# Patient Record
Sex: Male | Born: 1955
Health system: Southern US, Community
[De-identification: ages and names within clinical notes are randomized; demographics above are authoritative.]

## PROBLEM LIST (undated history)

## (undated) HISTORY — PX: COLONOSCOPY: SHX174

---

## 1998-05-09 ENCOUNTER — Encounter: Admission: RE | Admit: 1998-05-09 | Discharge: 1998-05-09 | Payer: Self-pay | Admitting: Sports Medicine

## 1998-08-30 ENCOUNTER — Encounter: Admission: RE | Admit: 1998-08-30 | Discharge: 1998-08-30 | Payer: Self-pay | Admitting: Family Medicine

## 1998-09-04 ENCOUNTER — Encounter: Admission: RE | Admit: 1998-09-04 | Discharge: 1998-09-04 | Payer: Self-pay | Admitting: Sports Medicine

## 1998-10-10 ENCOUNTER — Encounter: Admission: RE | Admit: 1998-10-10 | Discharge: 1998-10-10 | Payer: Self-pay | Admitting: Family Medicine

## 1998-11-06 ENCOUNTER — Encounter: Admission: RE | Admit: 1998-11-06 | Discharge: 1998-11-06 | Payer: Self-pay | Admitting: Sports Medicine

## 2000-03-26 ENCOUNTER — Encounter: Admission: RE | Admit: 2000-03-26 | Discharge: 2000-03-26 | Payer: Self-pay | Admitting: Family Medicine

## 2001-04-13 ENCOUNTER — Encounter: Admission: RE | Admit: 2001-04-13 | Discharge: 2001-04-13 | Payer: Self-pay | Admitting: Family Medicine

## 2002-04-11 ENCOUNTER — Encounter: Admission: RE | Admit: 2002-04-11 | Discharge: 2002-04-11 | Payer: Self-pay | Admitting: Family Medicine

## 2003-09-07 ENCOUNTER — Encounter: Admission: RE | Admit: 2003-09-07 | Discharge: 2003-09-07 | Payer: Self-pay | Admitting: Sports Medicine

## 2006-01-30 ENCOUNTER — Ambulatory Visit: Payer: Self-pay | Admitting: Family Medicine

## 2006-01-30 LAB — CONVERTED CEMR LAB: Glucose, Urine, Semiquant: 93

## 2006-07-09 DIAGNOSIS — M545 Low back pain, unspecified: Secondary | ICD-10-CM | POA: Insufficient documentation

## 2006-07-09 DIAGNOSIS — G43909 Migraine, unspecified, not intractable, without status migrainosus: Secondary | ICD-10-CM | POA: Insufficient documentation

## 2006-07-09 DIAGNOSIS — M7710 Lateral epicondylitis, unspecified elbow: Secondary | ICD-10-CM | POA: Insufficient documentation

## 2006-07-09 DIAGNOSIS — E7800 Pure hypercholesterolemia, unspecified: Secondary | ICD-10-CM | POA: Insufficient documentation

## 2006-07-09 DIAGNOSIS — Z87448 Personal history of other diseases of urinary system: Secondary | ICD-10-CM | POA: Insufficient documentation

## 2007-04-25 ENCOUNTER — Emergency Department (HOSPITAL_COMMUNITY): Admission: EM | Admit: 2007-04-25 | Discharge: 2007-04-25 | Payer: Self-pay | Admitting: Emergency Medicine

## 2009-02-08 ENCOUNTER — Ambulatory Visit: Payer: Self-pay | Admitting: Family Medicine

## 2009-02-13 ENCOUNTER — Encounter: Payer: Self-pay | Admitting: Family Medicine

## 2009-02-13 ENCOUNTER — Ambulatory Visit: Payer: Self-pay | Admitting: Family Medicine

## 2009-02-13 LAB — CONVERTED CEMR LAB
ALT: 31 units/L (ref 0–53)
AST: 27 units/L (ref 0–37)
Alkaline Phosphatase: 33 units/L — ABNORMAL LOW (ref 39–117)
Calcium: 9.3 mg/dL (ref 8.4–10.5)
Cholesterol: 259 mg/dL — ABNORMAL HIGH (ref 0–200)
Creatinine, Ser: 1.09 mg/dL (ref 0.40–1.50)
HDL: 50 mg/dL (ref >39)
Total CHOL/HDL Ratio: 5.2
Triglycerides: 96 mg/dL (ref <150)

## 2009-02-14 ENCOUNTER — Encounter: Payer: Self-pay | Admitting: Family Medicine

## 2009-02-20 ENCOUNTER — Encounter: Payer: Self-pay | Admitting: Family Medicine

## 2010-04-10 ENCOUNTER — Ambulatory Visit: Payer: Self-pay | Admitting: Family Medicine

## 2010-04-10 ENCOUNTER — Encounter: Payer: Self-pay | Admitting: Family Medicine

## 2010-04-11 LAB — CONVERTED CEMR LAB
ALT: 18 units/L (ref 0–53)
Albumin: 4.5 g/dL (ref 3.5–5.2)
BUN: 11 mg/dL (ref 6–23)
Cholesterol: 265 mg/dL — ABNORMAL HIGH (ref 0–200)
Creatinine, Ser: 1.02 mg/dL (ref 0.40–1.50)
Glucose, Bld: 107 mg/dL — ABNORMAL HIGH (ref 70–99)
Potassium: 4.2 meq/L (ref 3.5–5.3)
Total Bilirubin: 1 mg/dL (ref 0.3–1.2)
Total Protein: 7.4 g/dL (ref 6.0–8.3)
VLDL: 18 mg/dL (ref 0–40)

## 2010-05-21 ENCOUNTER — Telehealth: Payer: Self-pay | Admitting: Family Medicine

## 2010-06-13 NOTE — Assessment & Plan Note (Signed)
Summary: cpe/tlb   Vital Signs:  Patient profile:   55 year old male Height:      67 inches Weight:      178 pounds BMI:     27.98 Pulse rate:   68 / minute BP sitting:   121 / 81  (right arm) Cuff size:   regular  Vitals Entered By: Tessie Fass CMA (April 10, 2010 9:01 AM) CC: CPE, Lipid Management Is Patient Diabetic? No Pain Assessment Patient in pain? no        Primary Care [facility]:  Doree Albee MD  CC:  CPE and Lipid Management.  History of Present Illness: No acute complaints per pt. Back function and ROM approaching baseline per pt. 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   Lipid Management History:      Positive NCEP/ATP III risk factors include male age 19 years old or older.  Negative NCEP/ATP III risk factors include non-tobacco-user status.        Adjunctive measures started by the patient include fiber and weight reduction.  Comments include: Eats overall good diet. eats primarily baked goods and vegetables. has begun retraining for cycling. .     Habits & Providers  Alcohol-Tobacco-Diet     Alcohol drinks/day: 0     Tobacco Status: never  Exercise-Depression-Behavior     Does Patient Exercise: yes     Type of exercise: cycling      Have you felt down or hopeless? no     Have you felt little pleasure in things? no     STD Risk: never     Drug Use: never     Seat Belt Use: always     Sun Exposure: frequent  Current Problems (verified): 1)  Special Screening Malignant Neoplasm of Prostate  (ICD-V76.44) 2)  Lumbago  (ICD-724.2) 3)  Routine General Medical Exam@[facility]  (ICD-V70.0) 4)  Tennis Elbow  (ICD-726.32) 5)  Migraine, Unspec., w/o Intractable Migraine  (ICD-346.90) 6)  Hypercholesterolemia  (ICD-272.0) 7)  Hematuria, Hx of  (ICD-V13.09) 8)  Back Pain, Low  (ICD-724.2)  Allergies: No Known Drug Allergies  Past History:  Past Medical History: Last updated: 02/08/2009 mild seasonal allergies  Past Surgical  History: Last updated: 02/08/2009 Audiology screen - R ear w/ deficit - 04/11/2002 Fasting glucose 93 - 01/10/2006, Fasting glucose 94 - 09/07/2003 IVP negative - 09/10/1998, UA negative - 09/07/2003 Vision screen WNL - 04/11/2002  Social History: STD Risk:  never Sun Exposure-Excessive:  frequent  Physical Exam  General:  alert and well-developed.   Head:  normocephalic and atraumatic.   Eyes:  vision grossly intact.   Ears:  R ear normal and L ear normal.   Nose:  no external deformity.   Mouth:  good dentition.   Neck:  supple and full ROM.   Lungs:  normal respiratory effort and no wheezes.   Heart:  normal rate, regular rhythm, and no murmur.   Abdomen:  soft, non-tender, and normal bowel sounds.   Msk:  normal ROM.   Neurologic:  alert & oriented X3, cranial nerves II-XII intact, and strength normal in all extremities.     Impression & Recommendations:  Problem # 1:  ROUTINE GENERAL MEDICAL EXAM@[facility] (ICD-V70.0) Overall normal physical exam. will send for colonoscopy. Also encouraged goals for cycling.   Problem # 2:  HYPERCHOLESTEROLEMIA (ICD-272.0) Will recheck lipid panel and also refer for nutritional counseling. Has history of pravachol use in the past. Will likley need in  future.  Orders: T-Lipid Profile ([id number]) Nutrition Referral (Nutrition)  Other Orders: Colonoscopy (Colon) T-Hepatic Function [phone]) T-Comprehensive Metabolic Panel [id number])  Lipid Assessment/Plan:      Based on NCEP/ATP III, the patient's risk factor category is "0-1 risk factors".  The patient's lipid goals are as follows: Total cholesterol goal is 200; LDL cholesterol goal is 160; HDL cholesterol goal is 40; Triglyceride goal is 150.  His LDL cholesterol goal has not been met.    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    Pneumococcal vaccine: Not documented  Colorectal Screening   Hemoccult: Not documented    Colonoscopy: Not documented   Colonoscopy action/deferral: GI Referral  (04/10/2010)  Other Screening   PSA: 0.45  (02/13/2009)   PSA action/deferral: Discussed-PSA requested  (02/08/2009)   Smoking status: never  (04/10/2010)  Lipids   Total Cholesterol: 259  (02/13/2009)   Lipid panel action/deferral: Lipid Panel ordered   LDL: 190  (02/13/2009)   LDL Direct: Not documented   HDL: 50  (02/13/2009)   Triglycerides: 96  (02/13/2009)    SGOT (AST): 27  (02/13/2009)   BMP action: Ordered   SGPT (ALT): 31  (02/13/2009) CMP ordered    Alkaline phosphatase: 33  (02/13/2009)   Total bilirubin: 0.9  (02/13/2009)    Lipid flowsheet reviewed?: Yes   Progress toward LDL goal: Unchanged  Self-Management Support :   Personal Goals (by the next clinic visit) :      Personal LDL goal: 160  (02/08/2009)    Patient will work on the following items until the next clinic visit to reach self-care goals:     Eating: eat baked foods instead of fried foods, eat fruit for snacks and desserts  (04/10/2010)   Referred.    Lipid self-management support: Education handout, Referred for medical nutrition therapy  (04/10/2010)     Lipid education handout printed    Lipid self-management support not done because: Not indicated  (02/08/2009)   Nursing  Instructions: Screening colonoscopy ordered Refer for medical nutrition therapy (see order)    Diabetes Self Management Training Referral Patient Name: George Aguilar [facility] Date Of Birth: [birth year] MRN: [id number] Current Diagnosis:  SPECIAL SCREENING MALIGNANT NEOPLASM OF PROSTATE (ICD-V76.44) LUMBAGO (ICD-724.2) ROUTINE GENERAL MEDICAL EXAM@[facility] F[facility] (ICD-V70.0) TENNIS ELBOW (ICD-726.32) MIGRAINE, UNSPEC., W/O INTRACTABLE MIGRAINE (ICD-346.90) HYPERCHOLESTEROLEMIA (ICD-272.0) HEMATURIA, HX OF (ICD-V13.09) BACK PAIN, LOW (ICD-724.2)   Complicating Conditions:  Dyslipidemia  Appended Document: cpe/tlb    Clinical Lists Changes  Orders: Added new Test order of [facility]- Est Level  3 ([id number]) - Signed      Appended Document: cpe/tlb     Allergies: No Known Drug Allergies   Other Orders: [facility] - Est  40-64 yrs ([id number])   Orders Added: 1)  [facility] -  Est  40-64 yrs [[id number]]

## 2010-06-13 NOTE — Progress Notes (Signed)
Summary: results  Phone Note Call from Patient Call back at [phone]   Caller: Patient Summary of Call: Mr. Niu has been waiting for lab results from his visit last year.  Never received a call or letter.  Please call him back with info. Initial call taken by: Abundio Miu,  May 21, 2010 10:31 AM  Follow-up for Phone Call        calling again to see what results are - spoke with them about nurtrion appt and they are wondering why Follow-up by: De Nurse,  May 23, 2010 10:45 AM  Additional Follow-up for Phone Call Additional follow up Details #1::        Called to folowup with pt about lab results. Will start on crestor 20. Will followup in 1-2 months. Gave red flags about muscle pain and weakness. Pt agreeable to plan.  Doree Albee MD May 24, 2010 5:32 PM     New/Updated Medications: CRESTOR 20 MG TABS (ROSUVASTATIN CALCIUM) 1 tablet daily Prescriptions: CRESTOR 20 MG TABS (ROSUVASTATIN CALCIUM) 1 tablet daily  #30 x 6   Entered and Authorized by:   Doree Albee MD   Signed by:   Doree Albee MD on 05/24/2010   Method used:   Electronically to        [facility] (retail)       [address]       [place], [place]  [address]       Ph: [phone]       Fax: [phone]   RxID:   [id number]

## 2010-07-17 ENCOUNTER — Other Ambulatory Visit: Payer: Self-pay | Admitting: Family Medicine

## 2010-07-17 ENCOUNTER — Ambulatory Visit (INDEPENDENT_AMBULATORY_CARE_PROVIDER_SITE_OTHER): Payer: BC Managed Care – PPO | Admitting: Family Medicine

## 2010-07-17 ENCOUNTER — Encounter: Payer: Self-pay | Admitting: Family Medicine

## 2010-07-17 VITALS — BP 149/84 | HR 88 | Temp 98.4°F | Ht 67.0 in | Wt 182.0 lb

## 2010-07-17 DIAGNOSIS — R300 Dysuria: Secondary | ICD-10-CM

## 2010-07-17 DIAGNOSIS — Z202 Contact with and (suspected) exposure to infections with a predominantly sexual mode of transmission: Secondary | ICD-10-CM

## 2010-07-17 DIAGNOSIS — E785 Hyperlipidemia, unspecified: Secondary | ICD-10-CM

## 2010-07-17 DIAGNOSIS — E7800 Pure hypercholesterolemia, unspecified: Secondary | ICD-10-CM

## 2010-07-17 LAB — POCT URINALYSIS DIPSTICK
Bilirubin, UA: NEGATIVE
Ketones, UA: NEGATIVE
Nitrite, UA: NEGATIVE

## 2010-07-17 LAB — HIV ANTIBODY (ROUTINE TESTING W REFLEX): HIV: NONREACTIVE

## 2010-07-17 LAB — CONVERTED CEMR LAB: Chlamydia, Swab/Urine, PCR: NEGATIVE

## 2010-07-17 MED ORDER — ASPIRIN EC 81 MG PO TBEC: 81.0000 mg | DELAYED_RELEASE_TABLET | Freq: Every day | ORAL | Status: AC

## 2010-07-17 MED ORDER — PRAVASTATIN SODIUM 40 MG PO TABS: 40.0000 mg | ORAL_TABLET | Freq: Every evening | ORAL | Status: DC

## 2010-07-17 MED ORDER — METRONIDAZOLE 500 MG PO TABS: ORAL_TABLET | ORAL | Status: AC

## 2010-07-17 NOTE — Assessment & Plan Note (Signed)
Will check urine GC/chl, RPR, HIV. Will treat for trich. Discussed safe sex practices.

## 2010-07-17 NOTE — Assessment & Plan Note (Addendum)
Will start pt on pravachol 40 given LDL level of 189 and intolerance of crestor. Will follow up in 2 months for likely recheck of LDL. Encouraged continued low fat diet and exercise.

## 2010-07-17 NOTE — Patient Instructions (Signed)
Hypertriglyceridemia    Diet for High blood levels of Triglycerides  Most fats in food are triglycerides. Triglycerides in your blood are stored as fat in your body. High levels of triglycerides in your blood may put you at a greater risk for heart disease and stroke.    Normal triglyceride levels are less than 150 mg/dL. Borderline high levels are 150-199 mg/dl. High levels are 200 - 499 mg/dL, and very high triglyceride levels are greater than 500 mg/dL. The decision to treat high triglycerides is generally based on the level. For people with borderline or high triglyceride levels, treatment includes weight loss and exercise. Drugs are recommended for people with very high triglyceride levels.  Many people who need treatment for high triglyceride levels have metabolic syndrome. This syndrome is a collection of disorders that often include: insulin resistance, high blood pressure, blood clotting problems, high cholesterol and triglycerides.  TESTING PROCEDURE FOR TRIGLYCERIDES   You should not eat 4 hours before getting your triglycerides measured. The normal range of triglycerides is between 10 and 250 milligrams per deciliter (mg/dl). Some people may have extreme levels (1000 or above), but your triglyceride level may be too high if it is above 150 mg/dl, depending on what other risk factors you have for heart disease.    People with high blood triglycerides may also have high blood cholesterol levels. If you have high blood cholesterol as well as high blood triglycerides, your risk for heart disease is probably greater than if you only had high triglycerides. High blood cholesterol is one of the main risk factors for heart disease.   CHANGING YOUR DIET     Your weight can affect your blood triglyceride level. If you are more than 20% above your ideal body weight, you may be able to lower your blood triglycerides by losing weight. Eating less and exercising regularly is the best way to combat this. Fat provides more calories than any other food. The best way to lose weight is to eat less fat. Only 30% of your total calories should come from fat. Less than 7% of your diet should come from saturated fat. A diet low in fat and saturated fat is the same as a diet to decrease blood cholesterol. By eating a diet lower in fat, you may lose weight, lower your blood cholesterol, and lower your blood triglyceride level.    Eating a diet low in fat, especially saturated fat, may also help you lower your blood triglyceride level. Ask your dietitian to help you figure how much fat you can eat based on the number of calories your caregiver has prescribed for you.    Exercise, in addition to helping with weight loss may also help lower triglyceride levels.    Alcohol can increase blood triglycerides. You may need to stop drinking alcoholic beverages.    Too much carbohydrate in your diet may also increase your blood triglycerides. Some complex carbohydrates are necessary in your diet. These may include bread, rice, potatoes, other starchy vegetables and cereals.    Reduce "simple" carbohydrates. These may include pure sugars, candy, honey, and jelly without losing other nutrients. If you have the kind of high blood triglycerides that is affected by the amount of carbohydrates in your diet, you will need to eat less sugar and less high-sugar foods. Your caregiver can help you with this.    Adding 2-4 grams of fish oil (EPA+ DHA) may also help lower triglycerides. Speak with your caregiver before adding any supplements to   your regimen.   Following the Diet    Maintain your ideal weight. Your caregivers can help you with a diet. Generally, eating less food and getting more exercise will help you lose weight. Joining a weight control group may also help. Ask your caregivers for a good weight control group in your area.    Eat low-fat foods instead of high-fat foods. This can help you lose weight too.    These foods are lower in fat. Eat MORE of these:    Dried beans, peas, and lentils.      Egg whites.      Low-fat cottage cheese.      Fish.     Lean cuts of meat, such as round, sirloin, rump, and flank (cut extra fat off meat you fix).     Whole grain breads, cereals and pasta.      Skim and nonfat dry milk.      Low-fat yogurt.      Poultry without the skin.      Cheese made with skim or part-skim milk, such as mozzarella, parmesan, farmers', ricotta, or pot cheese.      These are higher fat foods. Eat LESS of these:    Whole milk and foods made from whole milk, such as American, blue, cheddar, monterey jack, and swiss cheese    High-fat meats, such as luncheon meats, sausages, knockwurst, bratwurst, hot dogs, ribs, corned beef, ground pork, and regular ground beef.    Fried foods.   Limit saturated fats in your diet. Substituting unsaturated fat for saturated fat may decrease your blood triglyceride level. You will need to read package labels to know which products contain saturated fats.    These foods are high in saturated fat. Eat LESS of these:    Fried pork skins.    Whole milk.      Skin and fat from poultry.      Palm oil.      Butter.     Shortening.     Cream cheese.      Bacon.     Margarines and baked goods made from listed oils.      Vegetable shortenings.     Chitterlings.    Fat from meats.      Coconut oil.      Palm kernel oil.      Lard.     Cream.     Sour cream.      Fatback.     Coffee whiteners and non-dairy creamers made with these oils.      Cheese made from whole milk.       Use unsaturated fats (both polyunsaturated and monounsaturated) moderately. Remember, even though unsaturated fats are better than saturated fats; you still want a diet low in total fat.    These foods are high in unsaturated fat:    Canola oil.    Sunflower oil.      Mayonnaise.     Almonds.     Peanuts.     Pine nuts.      Margarines made with these oils.     Safflower oil.    Olive oil.      Avocados.     Cashews.     Peanut butter.      Sunflower seeds.     Soybean oil.    Peanut oil.      Olives.     Pecans.     Walnuts.     Pumpkin seeds.        Avoid sugar and other high-sugar foods. This will decrease carbohydrates without decreasing other nutrients. Sugar in your food goes rapidly to your blood. When there is excess sugar in your blood, your liver may use it to make more triglycerides. Sugar also contains calories without other important nutrients.    Eat LESS of these:    Sugar, brown sugar, powdered sugar, jam, jelly, preserves, honey, syrup, molasses, pies, candy, cakes, cookies, frosting, pastries, colas, soft drinks, punches, fruit drinks, and regular gelatin.    Avoid alcohol. Alcohol, even more than sugar, may increase blood triglycerides. In addition, alcohol is high in calories and low in nutrients. Ask for sparkling water, or a diet soft drink instead of an alcoholic beverage.   Suggestions for planning and preparing meals    Bake, broil, grill or roast meats instead of frying.    Remove fat from meats and skin from poultry before cooking.    Add spices, herbs, lemon juice or vinegar to vegetables instead of salt, rich sauces or gravies.    Use a non-stick skillet without fat or use no-stick sprays.    Cool and refrigerate stews and broth. Then remove the hardened fat floating on the surface before serving.    Refrigerate meat drippings and skim off fat to make low-fat gravies.    Serve more fish.     Use less butter, margarine and other high-fat spreads on bread or vegetables.    Use skim or reconstituted non-fat dry milk for cooking.    Cook with low-fat cheeses.    Substitute low-fat yogurt or cottage cheese for all or part of the sour cream in recipes for sauces, dips or congealed salads.    Use half yogurt/half mayonnaise in salad recipes.    Substitute evaporated skim milk for cream. Evaporated skim milk or reconstituted non-fat dry milk can be whipped and substituted for whipped cream in certain recipes.    Choose fresh fruits for dessert instead of high-fat foods such as pies or cakes. Fruits are naturally low in fat.   When Dining Out    Order low-fat appetizers such as fruit or vegetable juice, pasta with vegetables or tomato sauce.    Select clear, rather than cream soups.    Ask that dressings and gravies be served on the side. Then use less of them.    Order foods that are baked, broiled, poached, steamed, stir-fried, or roasted.    Ask for margarine instead of butter, and use only a small amount.    Drink sparkling water, unsweetened tea or coffee, or diet soft drinks instead of alcohol or other sweet beverages.   QUESTIONS AND ANSWERS ABOUT OTHER FATS IN THE BLOOD:   SATURATED FAT, TRANS FAT, AND CHOLESTEROL  What is trans fat?  Trans fat is a type of fat that is formed when vegetable oil is hardened through a process called hydrogenation. This process helps makes foods more solid, gives them shape, and prolongs their shelf life. Trans fats are also called hydrogenated or partially hydrogenated oils.    What do saturated fat, trans fat, and cholesterol in foods have to do with heart disease?  Saturated fat, trans fat, and cholesterol in the diet all raise the level of LDL "bad" cholesterol in the blood. The higher the LDL cholesterol, the greater the risk for coronary heart disease (CHD). Saturated fat and trans fat raise LDL similarly.     What foods contain saturated fat, trans fat, and cholesterol?  High amounts of saturated fat   are found in animal products, such as fatty cuts of meat, chicken skin, and full-fat dairy products like butter, whole milk, cream, and cheese, and in tropical vegetable oils such as palm, palm kernel, and coconut oil. Trans fat is found in some of the same foods as saturated fat, such as vegetable shortening, some margarines (especially hard or stick margarine), crackers, cookies, baked goods, fried foods, salad dressings, and other processed foods made with partially hydrogenated vegetable oils. Small amounts of trans fat also occur naturally in some animal products, such as milk products, beef, and lamb. Foods high in cholesterol include liver, other organ meats, egg yolks, shrimp, and full-fat dairy products.  How can I use the new food label to make heart-healthy food choices?  Check the Nutrition Facts panel of the food label. Choose foods lower in saturated fat, trans fat, and cholesterol. For saturated fat and cholesterol, you can also use the Percent Daily Value (%DV): 5% DV or less is low, and 20% DV or more is high. (There is no %DV for trans fat.) Use the Nutrition Facts panel to choose foods low in saturated fat and cholesterol, and if the trans fat is not listed, read the ingredients and limit products that list shortening or hydrogenated or partially hydrogenated vegetable oil, which tend to be high in trans fat.  POINTS TO REMEMBER: YOU NEED A LITTLE TLC (THERAPEUTIC LIFESTYLE CHANGES)   Discuss your risk for heart disease with your caregivers, and take steps to reduce risk factors.    Change your diet. Choose foods that are low in saturated fat, trans fat, and cholesterol.     Add exercise to your daily routine if it is not already being done. Participate in physical activity of moderate intensity, like brisk walking, for at least 30 minutes on most, and preferably all days of the week. No time? Break the 30 minutes into three, 10-minute segments during the day.    Stop smoking. If you do smoke, contact your caregiver to discuss ways in which they can help you quit.    Do not use street drugs.    Maintain a normal weight.    Maintain a healthy blood pressure.    Keep up with your blood work for checking the fats in your blood as directed by your caregiver.   Document Released: 02/14/2004 Document Re-Released: 10/16/2009  [employer] Patient Information 2011 [employer].

## 2010-07-17 NOTE — Progress Notes (Signed)
  Subjective:    Patient ID: George Aguilar, male    DOB: [birth year], 55 y.o.   MRN: [id number]  Hyperlipidemia This is a chronic problem. The current episode started more than 1 year ago. The problem is uncontrolled. Recent lipid tests were reviewed and are high. Factors aggravating his hyperlipidemia include no known factors. Pertinent negatives include no chest pain, focal sensory loss, leg pain or myalgias. Current antihyperlipidemic treatment includes diet change and exercise (Pt was previously on crestor, however pt states that medication gave sever headaches.). The current treatment provides no improvement of lipids. Compliance problems include adherence to diet and medication side effects.  Risk factors for coronary artery disease include male sex.  Patient was previously on crestor for LDL 189. However pt discontinued secondary to severe headache. Has been compliant in low fat diet. Also exercising. 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   STD Exposure:  Pt reports recent unprotected sexual activity with male. Pt reports he recently found out that male was treated for trichomoniasis. Pt denies any dysuria, hematuria, back pain, increased urinary frequency.    Review of Systems  Cardiovascular: Negative for chest pain.  Musculoskeletal: Negative for myalgias.       Objective:   Physical Exam  Constitutional: He is oriented to person, place, and time. He appears well-developed and well-nourished.  HENT:  Head: Normocephalic and atraumatic.  Neck: Normal range of motion. Neck supple.  Cardiovascular: Normal rate, regular rhythm and normal heart sounds.   Pulmonary/Chest: Effort normal and breath sounds normal.  Abdominal: Soft.  Musculoskeletal: Normal range of motion.  Neurological: He is alert and oriented to person, place, and time.          Assessment & Plan:

## 2010-07-18 ENCOUNTER — Encounter: Payer: Self-pay | Admitting: Family Medicine

## 2010-07-18 LAB — GC/CHLAMYDIA PROBE AMP, URINE: GC Probe Amp, Urine: NEGATIVE

## 2011-02-17 LAB — POCT URINE HEMOGLOBIN: Hgb urine dipstick: NEGATIVE

## 2011-10-31 ENCOUNTER — Ambulatory Visit (INDEPENDENT_AMBULATORY_CARE_PROVIDER_SITE_OTHER): Payer: BC Managed Care – PPO | Admitting: Family Medicine

## 2011-10-31 ENCOUNTER — Encounter: Payer: Self-pay | Admitting: Family Medicine

## 2011-10-31 VITALS — BP 138/83 | HR 71 | Temp 98.0°F | Ht 67.0 in | Wt 181.0 lb

## 2011-10-31 DIAGNOSIS — E785 Hyperlipidemia, unspecified: Secondary | ICD-10-CM

## 2011-10-31 DIAGNOSIS — E7800 Pure hypercholesterolemia, unspecified: Secondary | ICD-10-CM

## 2011-10-31 DIAGNOSIS — Z0000 Encounter for general adult medical examination without abnormal findings: Secondary | ICD-10-CM | POA: Insufficient documentation

## 2011-10-31 HISTORY — DX: Encounter for general adult medical examination without abnormal findings: Z00.00

## 2011-10-31 LAB — COMPREHENSIVE METABOLIC PANEL
ALT: 46 U/L (ref 0–53)
AST: 36 U/L (ref 0–37)
Albumin: 4.3 g/dL (ref 3.5–5.2)
Alkaline Phosphatase: 36 U/L — ABNORMAL LOW (ref 39–117)
BUN: 15 mg/dL (ref 6–23)
CO2: 28 mEq/L (ref 19–32)
Calcium: 9.8 mg/dL (ref 8.4–10.5)
Glucose, Bld: 105 mg/dL — ABNORMAL HIGH (ref 70–99)
Potassium: 4.6 mEq/L (ref 3.5–5.3)
Total Bilirubin: 1.1 mg/dL (ref 0.3–1.2)
Total Protein: 7.1 g/dL (ref 6.0–8.3)

## 2011-10-31 NOTE — Patient Instructions (Signed)
Hypertriglyceridemia  Diet for High blood levels of Triglycerides Most fats in food are triglycerides. Triglycerides in your blood are stored as fat in your body. High levels of triglycerides in your blood may put you at a greater risk for heart disease and stroke.  Normal triglyceride levels are less than 150 mg/dL. Borderline high levels are 150-199 mg/dl. High levels are 200 - 499 mg/dL, and very high triglyceride levels are greater than 500 mg/dL. The decision to treat high triglycerides is generally based on the level. For people with borderline or high triglyceride levels, treatment includes weight loss and exercise. Drugs are recommended for people with very high triglyceride levels. Many people who need treatment for high triglyceride levels have metabolic syndrome. This syndrome is a collection of disorders that often include: insulin resistance, high blood pressure, blood clotting problems, high cholesterol and triglycerides. TESTING PROCEDURE FOR TRIGLYCERIDES  You should not eat 4 hours before getting your triglycerides measured. The normal range of triglycerides is between 10 and 250 milligrams per deciliter (mg/dl). Some people may have extreme levels (1000 or above), but your triglyceride level may be too high if it is above 150 mg/dl, depending on what other risk factors you have for heart disease.   People with high blood triglycerides may also have high blood cholesterol levels. If you have high blood cholesterol as well as high blood triglycerides, your risk for heart disease is probably greater than if you only had high triglycerides. High blood cholesterol is one of the main risk factors for heart disease.  CHANGING YOUR DIET  Your weight can affect your blood triglyceride level. If you are more than 20% above your ideal body weight, you may be able to lower your blood triglycerides by losing weight. Eating less and exercising regularly is the best way to combat this. Fat provides  more calories than any other food. The best way to lose weight is to eat less fat. Only 30% of your total calories should come from fat. Less than 7% of your diet should come from saturated fat. A diet low in fat and saturated fat is the same as a diet to decrease blood cholesterol. By eating a diet lower in fat, you may lose weight, lower your blood cholesterol, and lower your blood triglyceride level.  Eating a diet low in fat, especially saturated fat, may also help you lower your blood triglyceride level. Ask your dietitian to help you figure how much fat you can eat based on the number of calories your caregiver has prescribed for you.  Exercise, in addition to helping with weight loss may also help lower triglyceride levels.   Alcohol can increase blood triglycerides. You may need to stop drinking alcoholic beverages.   Too much carbohydrate in your diet may also increase your blood triglycerides. Some complex carbohydrates are necessary in your diet. These may include bread, rice, potatoes, other starchy vegetables and cereals.   Reduce "simple" carbohydrates. These may include pure sugars, candy, honey, and jelly without losing other nutrients. If you have the kind of high blood triglycerides that is affected by the amount of carbohydrates in your diet, you will need to eat less sugar and less high-sugar foods. Your caregiver can help you with this.   Adding 2-4 grams of fish oil (EPA+ DHA) may also help lower triglycerides. Speak with your caregiver before adding any supplements to your regimen.  Following the Diet  Maintain your ideal weight. Your caregivers can help you with a diet. Generally,   eating less food and getting more exercise will help you lose weight. Joining a weight control group may also help. Ask your caregivers for a good weight control group in your area.  Eat low-fat foods instead of high-fat foods. This can help you lose weight too.  These foods are lower in fat. Eat MORE  of these:   Dried beans, peas, and lentils.   Egg whites.   Low-fat cottage cheese.   Fish.   Lean cuts of meat, such as round, sirloin, rump, and flank (cut extra fat off meat you fix).   Whole grain breads, cereals and pasta.   Skim and nonfat dry milk.   Low-fat yogurt.   Poultry without the skin.   Cheese made with skim or part-skim milk, such as mozzarella, parmesan, farmers', ricotta, or pot cheese.  These are higher fat foods. Eat LESS of these:   Whole milk and foods made from whole milk, such as American, blue, cheddar, monterey jack, and swiss cheese   High-fat meats, such as luncheon meats, sausages, knockwurst, bratwurst, hot dogs, ribs, corned beef, ground pork, and regular ground beef.   Fried foods.  Limit saturated fats in your diet. Substituting unsaturated fat for saturated fat may decrease your blood triglyceride level. You will need to read package labels to know which products contain saturated fats.  These foods are high in saturated fat. Eat LESS of these:   Fried pork skins.   Whole milk.   Skin and fat from poultry.   Palm oil.   Butter.   Shortening.   Cream cheese.   Bacon.   Margarines and baked goods made from listed oils.   Vegetable shortenings.   Chitterlings.   Fat from meats.   Coconut oil.   Palm kernel oil.   Lard.   Cream.   Sour cream.   Fatback.   Coffee whiteners and non-dairy creamers made with these oils.   Cheese made from whole milk.  Use unsaturated fats (both polyunsaturated and monounsaturated) moderately. Remember, even though unsaturated fats are better than saturated fats; you still want a diet low in total fat.  These foods are high in unsaturated fat:   Canola oil.   Sunflower oil.   Mayonnaise.   Almonds.   Peanuts.   Pine nuts.   Margarines made with these oils.   Safflower oil.   Olive oil.   Avocados.   Cashews.   Peanut butter.   Sunflower seeds.   Soybean oil.     Peanut oil.   Olives.   Pecans.   Walnuts.   Pumpkin seeds.  Avoid sugar and other high-sugar foods. This will decrease carbohydrates without decreasing other nutrients. Sugar in your food goes rapidly to your blood. When there is excess sugar in your blood, your liver may use it to make more triglycerides. Sugar also contains calories without other important nutrients.  Eat LESS of these:   Sugar, brown sugar, powdered sugar, jam, jelly, preserves, honey, syrup, molasses, pies, candy, cakes, cookies, frosting, pastries, colas, soft drinks, punches, fruit drinks, and regular gelatin.   Avoid alcohol. Alcohol, even more than sugar, may increase blood triglycerides. In addition, alcohol is high in calories and low in nutrients. Ask for sparkling water, or a diet soft drink instead of an alcoholic beverage.  Suggestions for planning and preparing meals   Bake, broil, grill or roast meats instead of frying.   Remove fat from meats and skin from poultry before cooking.   Add spices,   herbs, lemon juice or vinegar to vegetables instead of salt, rich sauces or gravies.   Use a non-stick skillet without fat or use no-stick sprays.   Cool and refrigerate stews and broth. Then remove the hardened fat floating on the surface before serving.   Refrigerate meat drippings and skim off fat to make low-fat gravies.   Serve more fish.   Use less butter, margarine and other high-fat spreads on bread or vegetables.   Use skim or reconstituted non-fat dry milk for cooking.   Cook with low-fat cheeses.   Substitute low-fat yogurt or cottage cheese for all or part of the sour cream in recipes for sauces, dips or congealed salads.   Use half yogurt/half mayonnaise in salad recipes.   Substitute evaporated skim milk for cream. Evaporated skim milk or reconstituted non-fat dry milk can be whipped and substituted for whipped cream in certain recipes.   Choose fresh fruits for dessert instead of  high-fat foods such as pies or cakes. Fruits are naturally low in fat.  When Dining Out   Order low-fat appetizers such as fruit or vegetable juice, pasta with vegetables or tomato sauce.   Select clear, rather than cream soups.   Ask that dressings and gravies be served on the side. Then use less of them.   Order foods that are baked, broiled, poached, steamed, stir-fried, or roasted.   Ask for margarine instead of butter, and use only a small amount.   Drink sparkling water, unsweetened tea or coffee, or diet soft drinks instead of alcohol or other sweet beverages.  QUESTIONS AND ANSWERS ABOUT OTHER FATS IN THE BLOOD: SATURATED FAT, TRANS FAT, AND CHOLESTEROL What is trans fat? Trans fat is a type of fat that is formed when vegetable oil is hardened through a process called hydrogenation. This process helps makes foods more solid, gives them shape, and prolongs their shelf life. Trans fats are also called hydrogenated or partially hydrogenated oils.  What do saturated fat, trans fat, and cholesterol in foods have to do with heart disease? Saturated fat, trans fat, and cholesterol in the diet all raise the level of LDL "bad" cholesterol in the blood. The higher the LDL cholesterol, the greater the risk for coronary heart disease (CHD). Saturated fat and trans fat raise LDL similarly.  What foods contain saturated fat, trans fat, and cholesterol? High amounts of saturated fat are found in animal products, such as fatty cuts of meat, chicken skin, and full-fat dairy products like butter, whole milk, cream, and cheese, and in tropical vegetable oils such as palm, palm kernel, and coconut oil. Trans fat is found in some of the same foods as saturated fat, such as vegetable shortening, some margarines (especially hard or stick margarine), crackers, cookies, baked goods, fried foods, salad dressings, and other processed foods made with partially hydrogenated vegetable oils. Small amounts of trans fat  also occur naturally in some animal products, such as milk products, beef, and lamb. Foods high in cholesterol include liver, other organ meats, egg yolks, shrimp, and full-fat dairy products. How can I use the new food label to make heart-healthy food choices? Check the Nutrition Facts panel of the food label. Choose foods lower in saturated fat, trans fat, and cholesterol. For saturated fat and cholesterol, you can also use the Percent Daily Value (%DV): 5% DV or less is low, and 20% DV or more is high. (There is no %DV for trans fat.) Use the Nutrition Facts panel to choose foods low in   saturated fat and cholesterol, and if the trans fat is not listed, read the ingredients and limit products that list shortening or hydrogenated or partially hydrogenated vegetable oil, which tend to be high in trans fat. POINTS TO REMEMBER: YOU NEED A LITTLE TLC (THERAPEUTIC LIFESTYLE CHANGES)  Discuss your risk for heart disease with your caregivers, and take steps to reduce risk factors.   Change your diet. Choose foods that are low in saturated fat, trans fat, and cholesterol.   Add exercise to your daily routine if it is not already being done. Participate in physical activity of moderate intensity, like brisk walking, for at least 30 minutes on most, and preferably all days of the week. No time? Break the 30 minutes into three, 10-minute segments during the day.   Stop smoking. If you do smoke, contact your caregiver to discuss ways in which they can help you quit.   Do not use street drugs.   Maintain a normal weight.   Maintain a healthy blood pressure.   Keep up with your blood work for checking the fats in your blood as directed by your caregiver.  Document Released: 02/14/2004 Document Revised: 04/17/2011 Document Reviewed: 09/11/2008 [employer] Patient Information 2012 [employer]. 

## 2011-10-31 NOTE — Progress Notes (Signed)
  Subjective:    Patient ID: George Aguilar, male    DOB: May 21, 1955, 56 y.o.   MRN: [id number]  HPI Pt presents today for annual exam.  No acute issues or concerns currently.  HLD: Has been on crestor and pravachol for this in the past. Has had some nonspecific complaints of headache assd with statins. Has not been compliant with this.  Failed to follow up for recheck.  Lab Results  Component Value Date   LDLCALC 189* 04/10/2010  Has not been on statin for around 1 year.   Had colonoscopy 2012 that was normal. Not due until 2022.  From a physical activity standpoint, pt cycles 8-10 hours per week.  Review of Systems See HPI, otherwise ROS negative.     Objective:   Physical Exam Gen: up in chair, NAD HEENT: NCAT, EOMI, TMs clear bilaterally CV: RRR, no murmurs auscultated PULM: CTAB, no wheezes, rales, rhoncii ABD: S/NT/+ bowel sounds  EXT: 2+ peripheral pulses Neuro: CN II-XII grossly intact. Otherwise normal neuro exam.      Assessment & Plan:

## 2011-10-31 NOTE — Assessment & Plan Note (Signed)
Otherwise normal annual exam. Encouraged continued cycling and physical activity. Up to date on colonoscopy. Follow up in 1year.

## 2011-10-31 NOTE — Assessment & Plan Note (Signed)
Rechecking LDL today. 

## 2011-11-25 ENCOUNTER — Telehealth: Payer: Self-pay | Admitting: Family Medicine

## 2011-11-25 NOTE — Telephone Encounter (Signed)
Pt has not heard anything about his test results - pls advise

## 2011-11-26 NOTE — Telephone Encounter (Signed)
Called and informed pt of results. Laureen Ochs, Viann Shove

## 2012-11-04 ENCOUNTER — Ambulatory Visit: Payer: BC Managed Care – PPO

## 2012-11-04 ENCOUNTER — Ambulatory Visit: Payer: BC Managed Care – PPO | Admitting: Family Medicine

## 2012-11-04 VITALS — BP 126/78 | HR 76 | Temp 98.7°F | Resp 18 | Ht 68.0 in | Wt 185.2 lb

## 2012-11-04 DIAGNOSIS — R202 Paresthesia of skin: Secondary | ICD-10-CM

## 2012-11-04 DIAGNOSIS — Z87448 Personal history of other diseases of urinary system: Secondary | ICD-10-CM

## 2012-11-04 DIAGNOSIS — M25512 Pain in left shoulder: Secondary | ICD-10-CM

## 2012-11-04 DIAGNOSIS — M47812 Spondylosis without myelopathy or radiculopathy, cervical region: Secondary | ICD-10-CM

## 2012-11-04 DIAGNOSIS — M25519 Pain in unspecified shoulder: Secondary | ICD-10-CM

## 2012-11-04 DIAGNOSIS — R209 Unspecified disturbances of skin sensation: Secondary | ICD-10-CM

## 2012-11-04 LAB — POCT URINALYSIS DIPSTICK
Ketones, UA: NEGATIVE
Nitrite, UA: NEGATIVE
Protein, UA: NEGATIVE
Urobilinogen, UA: 0.2

## 2012-11-04 LAB — POCT UA - MICROSCOPIC ONLY
Casts, Ur, LPF, POC: NEGATIVE
Mucus, UA: NEGATIVE
RBC, urine, microscopic: NEGATIVE
Yeast, UA: NEGATIVE

## 2012-11-04 MED ORDER — MELOXICAM 7.5 MG PO TABS: 7.5000 mg | ORAL_TABLET | Freq: Every day | ORAL | Status: DC

## 2012-11-04 NOTE — Patient Instructions (Addendum)
Try the mobic once per day, stretches and heat to neck and exercises for shoulder - recheck in next 3-4 weeks if not improving. Return to the clinic or go to the nearest emergency room if any of your symptoms worsen or new symptoms occur. Impingement Syndrome, Rotator Cuff, Bursitis with Rehab Impingement syndrome is a condition that involves inflammation of the tendons of the rotator cuff and the subacromial bursa, that causes pain in the shoulder. The rotator cuff consists of four tendons and muscles that control much of the shoulder and upper arm function. The subacromial bursa is a fluid filled sac that helps reduce friction between the rotator cuff and one of the bones of the shoulder (acromion). Impingement syndrome is usually an overuse injury that causes swelling of the bursa (bursitis), swelling of the tendon (tendonitis), and/or a tear of the tendon (strain). Strains are classified into three categories. Grade 1 strains cause pain, but the tendon is not lengthened. Grade 2 strains include a lengthened ligament, due to the ligament being stretched or partially ruptured. With grade 2 strains there is still function, although the function may be decreased. Grade 3 strains include a complete tear of the tendon or muscle, and function is usually impaired. SYMPTOMS   Pain around the shoulder, often at the outer portion of the upper arm.  Pain that gets worse with shoulder function, especially when reaching overhead or lifting.  Sometimes, aching when not using the arm.  Pain that wakes you up at night.  Sometimes, tenderness, swelling, warmth, or redness over the affected area.  Loss of strength.  Limited motion of the shoulder, especially reaching behind the back (to the back pocket or to unhook bra) or across your body.  Crackling sound (crepitation) when moving the arm.  Biceps tendon pain and inflammation (in the front of the shoulder). Worse when bending the elbow or lifting. CAUSES    Impingement syndrome is often an overuse injury, in which chronic (repetitive) motions cause the tendons or bursa to become inflamed. A strain occurs when a force is paced on the tendon or muscle that is greater than it can withstand. Common mechanisms of injury include: Stress from sudden increase in duration, frequency, or intensity of training.  Direct hit (trauma) to the shoulder.  Aging, erosion of the tendon with normal use.  Bony bump on shoulder (acromial spur). RISK INCREASES WITH:  Contact sports (football, wrestling, boxing).  Throwing sports (baseball, tennis, volleyball).  Weightlifting and bodybuilding.  Heavy labor.  Previous injury to the rotator cuff, including impingement.  Poor shoulder strength and flexibility.  Failure to warm up properly before activity.  Inadequate protective equipment.  Old age.  Bony bump on shoulder (acromial spur). PREVENTION   Warm up and stretch properly before activity.  Allow for adequate recovery between workouts.  Maintain physical fitness:  Strength, flexibility, and endurance.  Cardiovascular fitness.  Learn and use proper exercise technique. PROGNOSIS  If treated properly, impingement syndrome usually goes away within 6 weeks. Sometimes surgery is required.  RELATED COMPLICATIONS   Longer healing time if not properly treated, or if not given enough time to heal.  Recurring symptoms, that result in a chronic condition.  Shoulder stiffness, frozen shoulder, or loss of motion.  Rotator cuff tendon tear.  Recurring symptoms, especially if activity is resumed too soon, with overuse, with a direct blow, or when using poor technique. TREATMENT  Treatment first involves the use of ice and medicine, to reduce pain and inflammation. The use of  strengthening and stretching exercises may help reduce pain with activity. These exercises may be performed at home or with a therapist. If non-surgical treatment is  unsuccessful after more than 6 months, surgery may be advised. After surgery and rehabilitation, activity is usually possible in 3 months.  MEDICATION  If pain medicine is needed, nonsteroidal anti-inflammatory medicines (aspirin and ibuprofen), or other minor pain relievers (acetaminophen), are often advised.  Do not take pain medicine for 7 days before surgery.  Prescription pain relievers may be given, if your caregiver thinks they are needed. Use only as directed and only as much as you need.  Corticosteroid injections may be given by your caregiver. These injections should be reserved for the most serious cases, because they may only be given a certain number of times. HEAT AND COLD  Cold treatment (icing) should be applied for 10 to 15 minutes every 2 to 3 hours for inflammation and pain, and immediately after activity that aggravates your symptoms. Use ice packs or an ice massage.  Heat treatment may be used before performing stretching and strengthening activities prescribed by your caregiver, physical therapist, or athletic trainer. Use a heat pack or a warm water soak. SEEK MEDICAL CARE IF:   Symptoms get worse or do not improve in 4 to 6 weeks, despite treatment.  New, unexplained symptoms develop. (Drugs used in treatment may produce side effects.) EXERCISES  RANGE OF MOTION (ROM) AND STRETCHING EXERCISES - Impingement Syndrome (Rotator Cuff  Tendinitis, Bursitis) These exercises may help you when beginning to rehabilitate your injury. Your symptoms may go away with or without further involvement from your physician, physical therapist or athletic trainer. While completing these exercises, remember:   Restoring tissue flexibility helps normal motion to return to the joints. This allows healthier, less painful movement and activity.  An effective stretch should be held for at least 30 seconds.  A stretch should never be painful. You should only feel a gentle lengthening or  release in the stretched tissue. STRETCH  Flexion, Standing  Stand with good posture. With an underhand grip on your right / left hand, and an overhand grip on the opposite hand, grasp a broomstick or cane so that your hands are a little more than shoulder width apart.  Keeping your right / left elbow straight and shoulder muscles relaxed, push the stick with your opposite hand, to raise your right / left arm in front of your body and then overhead. Raise your arm until you feel a stretch in your right / left shoulder, but before you have increased shoulder pain.  Try to avoid shrugging your right / left shoulder as your arm rises, by keeping your shoulder blade tucked down and toward your mid-back spine. Hold for __________ seconds.  Slowly return to the starting position. Repeat __________ times. Complete this exercise __________ times per day. STRETCH  Abduction, Supine  Lie on your back. With an underhand grip on your right / left hand and an overhand grip on the opposite hand, grasp a broomstick or cane so that your hands are a little more than shoulder width apart.  Keeping your right / left elbow straight and your shoulder muscles relaxed, push the stick with your opposite hand, to raise your right / left arm out to the side of your body and then overhead. Raise your arm until you feel a stretch in your right / left shoulder, but before you have increased shoulder pain.  Try to avoid shrugging your right / left  shoulder as your arm rises, by keeping your shoulder blade tucked down and toward your mid-back spine. Hold for __________ seconds.  Slowly return to the starting position. Repeat __________ times. Complete this exercise __________ times per day. ROM  Flexion, Active-Assisted  Lie on your back. You may bend your knees for comfort.  Grasp a broomstick or cane so your hands are about shoulder width apart. Your right / left hand should grip the end of the stick, so that your hand  is positioned "thumbs-up," as if you were about to shake hands.  Using your healthy arm to lead, raise your right / left arm overhead, until you feel a gentle stretch in your shoulder. Hold for __________ seconds.  Use the stick to assist in returning your right / left arm to its starting position. Repeat __________ times. Complete this exercise __________ times per day.  ROM - Internal Rotation, Supine   Lie on your back on a firm surface. Place your right / left elbow about 60 degrees away from your side. Elevate your elbow with a folded towel, so that the elbow and shoulder are the same height.  Using a broomstick or cane and your strong arm, pull your right / left hand toward your body until you feel a gentle stretch, but no increase in your shoulder pain. Keep your shoulder and elbow in place throughout the exercise.  Hold for __________ seconds. Slowly return to the starting position. Repeat __________ times. Complete this exercise __________ times per day. STRETCH - Internal Rotation  Place your right / left hand behind your back, palm up.  Throw a towel or belt over your opposite shoulder. Grasp the towel with your right / left hand.  While keeping an upright posture, gently pull up on the towel, until you feel a stretch in the front of your right / left shoulder.  Avoid shrugging your right / left shoulder as your arm rises, by keeping your shoulder blade tucked down and toward your mid-back spine.  Hold for __________ seconds. Release the stretch, by lowering your healthy hand. Repeat __________ times. Complete this exercise __________ times per day. ROM - Internal Rotation   Using an underhand grip, grasp a stick behind your back with both hands.  While standing upright with good posture, slide the stick up your back until you feel a mild stretch in the front of your shoulder.  Hold for __________ seconds. Slowly return to your starting position. Repeat __________ times.  Complete this exercise __________ times per day.  STRETCH  Posterior Shoulder Capsule   Stand or sit with good posture. Grasp your right / left elbow and draw it across your chest, keeping it at the same height as your shoulder.  Pull your elbow, so your upper arm comes in closer to your chest. Pull until you feel a gentle stretch in the back of your shoulder.  Hold for __________ seconds. Repeat __________ times. Complete this exercise __________ times per day. STRENGTHENING EXERCISES - Impingement Syndrome (Rotator Cuff Tendinitis, Bursitis) These exercises may help you when beginning to rehabilitate your injury. They may resolve your symptoms with or without further involvement from your physician, physical therapist or athletic trainer. While completing these exercises, remember:  Muscles can gain both the endurance and the strength needed for everyday activities through controlled exercises.  Complete these exercises as instructed by your physician, physical therapist or athletic trainer. Increase the resistance and repetitions only as guided.  You may experience muscle soreness or fatigue,  but the pain or discomfort you are trying to eliminate should never worsen during these exercises. If this pain does get worse, stop and make sure you are following the directions exactly. If the pain is still present after adjustments, discontinue the exercise until you can discuss the trouble with your clinician.  During your recovery, avoid activity or exercises which involve actions that place your injured hand or elbow above your head or behind your back or head. These positions stress the tissues which you are trying to heal. STRENGTH - Scapular Depression and Adduction   With good posture, sit on a firm chair. Support your arms in front of you, with pillows, arm rests, or on a table top. Have your elbows in line with the sides of your body.  Gently draw your shoulder blades down and toward your  mid-back spine. Gradually increase the tension, without tensing the muscles along the top of your shoulders and the back of your neck.  Hold for __________ seconds. Slowly release the tension and relax your muscles completely before starting the next repetition.  After you have practiced this exercise, remove the arm support and complete the exercise in standing as well as sitting position. Repeat __________ times. Complete this exercise __________ times per day.  STRENGTH - Shoulder Abductors, Isometric  With good posture, stand or sit about 4-6 inches from a wall, with your right / left side facing the wall.  Bend your right / left elbow. Gently press your right / left elbow into the wall. Increase the pressure gradually, until you are pressing as hard as you can, without shrugging your shoulder or increasing any shoulder discomfort.  Hold for __________ seconds.  Release the tension slowly. Relax your shoulder muscles completely before you begin the next repetition. Repeat __________ times. Complete this exercise __________ times per day.  STRENGTH - External Rotators, Isometric  Keep your right / left elbow at your side and bend it 90 degrees.  Step into a door frame so that the outside of your right / left wrist can press against the door frame without your upper arm leaving your side.  Gently press your right / left wrist into the door frame, as if you were trying to swing the back of your hand away from your stomach. Gradually increase the tension, until you are pressing as hard as you can, without shrugging your shoulder or increasing any shoulder discomfort.  Hold for __________ seconds.  Release the tension slowly. Relax your shoulder muscles completely before you begin the next repetition. Repeat __________ times. Complete this exercise __________ times per day.  STRENGTH - Supraspinatus   Stand or sit with good posture. Grasp a __________ weight, or an exercise band or  tubing, so that your hand is "thumbs-up," like you are shaking hands.  Slowly lift your right / left arm in a "V" away from your thigh, diagonally into the space between your side and straight ahead. Lift your hand to shoulder height or as far as you can, without increasing any shoulder pain. At first, many people do not lift their hands above shoulder height.  Avoid shrugging your right / left shoulder as your arm rises, by keeping your shoulder blade tucked down and toward your mid-back spine.  Hold for __________ seconds. Control the descent of your hand, as you slowly return to your starting position. Repeat __________ times. Complete this exercise __________ times per day.  STRENGTH - External Rotators  Secure a rubber exercise band or tubing  to a fixed object (table, pole) so that it is at the same height as your right / left elbow when you are standing or sitting on a firm surface.  Stand or sit so that the secured exercise band is at your uninjured side.  Bend your right / left elbow 90 degrees. Place a folded towel or small pillow under your right / left arm, so that your elbow is a few inches away from your side.  Keeping the tension on the exercise band, pull it away from your body, as if pivoting on your elbow. Be sure to keep your body steady, so that the movement is coming only from your rotating shoulder.  Hold for __________ seconds. Release the tension in a controlled manner, as you return to the starting position. Repeat __________ times. Complete this exercise __________ times per day.  STRENGTH - Internal Rotators   Secure a rubber exercise band or tubing to a fixed object (table, pole) so that it is at the same height as your right / left elbow when you are standing or sitting on a firm surface.  Stand or sit so that the secured exercise band is at your right / left side.  Bend your elbow 90 degrees. Place a folded towel or small pillow under your right / left arm so  that your elbow is a few inches away from your side.  Keeping the tension on the exercise band, pull it across your body, toward your stomach. Be sure to keep your body steady, so that the movement is coming only from your rotating shoulder.  Hold for __________ seconds. Release the tension in a controlled manner, as you return to the starting position. Repeat __________ times. Complete this exercise __________ times per day.  STRENGTH - Scapular Protractors, Standing   Stand arms length away from a wall. Place your hands on the wall, keeping your elbows straight.  Begin by dropping your shoulder blades down and toward your mid-back spine.  To strengthen your protractors, keep your shoulder blades down, but slide them forward on your rib cage. It will feel as if you are lifting the back of your rib cage away from the wall. This is a subtle motion and can be challenging to complete. Ask your caregiver for further instruction, if you are not sure you are doing the exercise correctly.  Hold for __________ seconds. Slowly return to the starting position, resting the muscles completely before starting the next repetition. Repeat __________ times. Complete this exercise __________ times per day. STRENGTH - Scapular Protractors, Supine  Lie on your back on a firm surface. Extend your right / left arm straight into the air while holding a __________ weight in your hand.  Keeping your head and back in place, lift your shoulder off the floor.  Hold for __________ seconds. Slowly return to the starting position, and allow your muscles to relax completely before starting the next repetition. Repeat __________ times. Complete this exercise __________ times per day. STRENGTH - Scapular Protractors, Quadruped  Get onto your hands and knees, with your shoulders directly over your hands (or as close as you can be, comfortably).  Keeping your elbows locked, lift the back of your rib cage up into your  shoulder blades, so your mid-back rounds out. Keep your neck muscles relaxed.  Hold this position for __________ seconds. Slowly return to the starting position and allow your muscles to relax completely before starting the next repetition. Repeat __________ times. Complete this exercise __________  times per day.  STRENGTH - Scapular Retractors  Secure a rubber exercise band or tubing to a fixed object (table, pole), so that it is at the height of your shoulders when you are either standing, or sitting on a firm armless chair.  With a palm down grip, grasp an end of the band in each hand. Straighten your elbows and lift your hands straight in front of you, at shoulder height. Step back, away from the secured end of the band, until it becomes tense.  Squeezing your shoulder blades together, draw your elbows back toward your sides, as you bend them. Keep your upper arms lifted away from your body throughout the exercise.  Hold for __________ seconds. Slowly ease the tension on the band, as you reverse the directions and return to the starting position. Repeat __________ times. Complete this exercise __________ times per day. STRENGTH - Shoulder Extensors   Secure a rubber exercise band or tubing to a fixed object (table, pole) so that it is at the height of your shoulders when you are either standing, or sitting on a firm armless chair.  With a thumbs-up grip, grasp an end of the band in each hand. Straighten your elbows and lift your hands straight in front of you, at shoulder height. Step back, away from the secured end of the band, until it becomes tense.  Squeezing your shoulder blades together, pull your hands down to the sides of your thighs. Do not allow your hands to go behind you.  Hold for __________ seconds. Slowly ease the tension on the band, as you reverse the directions and return to the starting position. Repeat __________ times. Complete this exercise __________ times per day.    STRENGTH - Scapular Retractors and External Rotators   Secure a rubber exercise band or tubing to a fixed object (table, pole) so that it is at the height as your shoulders, when you are either standing, or sitting on a firm armless chair.  With a palm down grip, grasp an end of the band in each hand. Bend your elbows 90 degrees and lift your elbows to shoulder height, at your sides. Step back, away from the secured end of the band, until it becomes tense.  Squeezing your shoulder blades together, rotate your shoulders so that your upper arms and elbows remain stationary, but your fists travel upward to head height.  Hold for __________ seconds. Slowly ease the tension on the band, as you reverse the directions and return to the starting position. Repeat __________ times. Complete this exercise __________ times per day.  STRENGTH - Scapular Retractors and External Rotators, Rowing   Secure a rubber exercise band or tubing to a fixed object (table, pole) so that it is at the height of your shoulders, when you are either standing, or sitting on a firm armless chair.  With a palm down grip, grasp an end of the band in each hand. Straighten your elbows and lift your hands straight in front of you, at shoulder height. Step back, away from the secured end of the band, until it becomes tense.  Step 1: Squeeze your shoulder blades together. Bending your elbows, draw your hands to your chest, as if you are rowing a boat. At the end of this motion, your hands and elbow should be at shoulder height and your elbows should be out to your sides.  Step 2: Rotate your shoulders, to raise your hands above your head. Your forearms should be vertical and your  upper arms should be horizontal.  Hold for __________ seconds. Slowly ease the tension on the band, as you reverse the directions and return to the starting position. Repeat __________ times. Complete this exercise __________ times per day.  STRENGTH   Scapular Depressors  Find a sturdy chair without wheels, such as a dining room chair.  Keeping your feet on the floor, and your hands on the chair arms, lift your bottom up from the seat, and lock your elbows.  Keeping your elbows straight, allow gravity to pull your body weight down. Your shoulders will rise toward your ears.  Raise your body against gravity by drawing your shoulder blades down your back, shortening the distance between your shoulders and ears. Although your feet should always maintain contact with the floor, your feet should progressively support less body weight, as you get stronger.  Hold for __________ seconds. In a controlled and slow manner, lower your body weight to begin the next repetition. Repeat __________ times. Complete this exercise __________ times per day.  Document Released: 04/28/2005 Document Revised: 07/21/2011 Document Reviewed: 08/10/2008 [facility] Patient Information 2014 [place], [place].

## 2012-11-04 NOTE — Progress Notes (Signed)
Subjective:    Patient ID: George Aguilar, male    DOB: 1955/07/18, 57 y.o.   MRN: [id number]  HPI George Aguilar is a 57 y.o. male Pain in L shoulder, few weeks ago.  Noticed after sleeping on that side, NKI. No neck pain. Last 2 days noticed tingling in L thumb and 2nd-3rd fingers. No chest pains. R hand dominant. Tx: none no prior neck injury/surgery. 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 No new work, but did have to crawl through attic space about once per week. Soreness in shoulder, no weakness. Sore with overhead work on L.  Noticed some particle in urine past 3 days. No dysuria, no hematuria.no frequency, no urgency. No hx of stones known. Hx of hematuria years ago thought to be due to std  (trichomonas) or kidney issue? No back pain, no difficulty urinating. No penile discharge. No tenesmus. New sexual partner 2 months ago - condoms used - no unprotected sex.  Last physical about a year ago - not sure if PSA done.    Review of Systems  Constitutional: Negative for fever and chills.  Respiratory: Negative for chest tightness.   Cardiovascular: Negative for chest pain and leg swelling.  Genitourinary: Negative for dysuria (noticed sediment in urine  - not sore. ), urgency, frequency, hematuria, decreased urine volume, discharge, penile swelling, scrotal swelling, difficulty urinating, penile pain and testicular pain.  Musculoskeletal: Positive for arthralgias. Negative for joint swelling.  Neurological: Negative for weakness.       Objective:   Physical Exam  Vitals reviewed. Constitutional: He is oriented to person, place, and time. He appears well-developed and well-nourished.  HENT:  Head: Normocephalic and atraumatic.  Right Ear: Tympanic membrane and ear canal normal.  Left Ear: Tympanic membrane and ear canal normal.  Nose: No rhinorrhea.  Mouth/Throat: Mucous membranes are normal. No posterior oropharyngeal erythema.  Eyes: Conjunctivae are normal. Pupils are equal, round, and  reactive to light.  Neck: Neck supple.  Cardiovascular: Normal rate, regular rhythm, normal heart sounds and intact distal pulses.   No murmur heard. Pulmonary/Chest: Effort normal and breath sounds normal. He has no wheezes. He has no rhonchi. He has no rales.  Abdominal: Soft. Bowel sounds are normal. He exhibits no distension. There is no tenderness. There is no rebound and no guarding.  Musculoskeletal:       Left shoulder: He exhibits normal range of motion, no tenderness, no bony tenderness ([place]/ac nt), no spasm, normal pulse and normal strength (full rtc streght, but slight disconmfort with internal rotation, positive Neer, negative crossover/hawkins/empty can. ).       Cervical back: He exhibits decreased range of motion (lacks ext, r>L roataion and L lateral fleion - reporduces some of L shoulder sx's, but no hand sx's in office. ). He exhibits no tenderness, no bony tenderness and no spasm.  Lymphadenopathy:    He has no cervical adenopathy.  Neurological: He is alert and oriented to person, place, and time. He has normal strength. No sensory deficit.  Reflex Scores:      Tricep reflexes are 0 on the right side and 0 on the left side.      Bicep reflexes are 1+ on the right side and 0 on the left side.      Brachioradialis reflexes are 0 on the right side and 0 on the left side. Difficult to obtain bilateral ue reflexes.   Skin: Skin is warm and dry. No rash noted.  Psychiatric: He has a normal mood and  affect. His behavior is normal.     [facility] reading (PRIMARY) by  Dr. Neva Seat: C spine: spondylosis, degen dz C3-C6, with decreased lordosis.  L shoulder  - NAD.   Results for orders placed in visit on 11/04/12  POCT UA - MICROSCOPIC ONLY      Result Value Range   WBC, Ur, HPF, POC neg     RBC, urine, microscopic neg     Bacteria, U Microscopic neg     Mucus, UA neg     Epithelial cells, urine per micros neg     Crystals, Ur, HPF, POC neg     Casts, Ur, LPF, POC neg     Yeast,  UA neg    POCT URINALYSIS DIPSTICK      Result Value Range   Color, UA yellow     Clarity, UA clear     Glucose, UA neg     Bilirubin, UA neg     Ketones, UA neg     Spec Grav, UA 1.025     Blood, UA neg     pH, UA 5.5     Protein, UA neg     Urobilinogen, UA 0.2     Nitrite, UA neg     Leukocytes, UA Negative        Assessment & Plan:  George Aguilar is a 57 y.o. male Left shoulder pain - Plan: DG Shoulder Left with hand parasthesia -  - Plan: DG Cervical Spine 2 or 3 views.  May have some neuronal impingement with degn dz of neck, but also some impingement findings on exam.  Trial of mobic, rom/stretches/hep, avoid repetitive overhead work as able, and recheck in next 2-4 weeks if not improving.   History of hematuria - Plan: GC/Chlamydia Probe Amp, POCT UA - Microscopic Only, POCT urinalysis dipstick - clear today.  Will check for STi's on urine, but reassuring testing in office and no hematuria.  Increase fluids and if sediment returns - recheck with possible urology eval. Recommended scheduling physical with possible PSA testing, but to rtc sooner if not improved.   Meds ordered this encounter  Medications  . meloxicam (MOBIC) 7.5 MG tablet    Sig: Take 1 tablet (7.5 mg total) by mouth daily.    Dispense:  30 tablet    Refill:  0   Patient Instructions  Try the mobic once per day, stretches and heat to neck and exercises for shoulder - recheck in next 3-4 weeks if not improving. Return to the clinic or go to the nearest emergency room if any of your symptoms worsen or new symptoms occur. Impingement Syndrome, Rotator Cuff, Bursitis with Rehab Impingement syndrome is a condition that involves inflammation of the tendons of the rotator cuff and the subacromial bursa, that causes pain in the shoulder. The rotator cuff consists of four tendons and muscles that control much of the shoulder and upper arm function. The subacromial bursa is a fluid filled sac that helps reduce  friction between the rotator cuff and one of the bones of the shoulder (acromion). Impingement syndrome is usually an overuse injury that causes swelling of the bursa (bursitis), swelling of the tendon (tendonitis), and/or a tear of the tendon (strain). Strains are classified into three categories. Grade 1 strains cause pain, but the tendon is not lengthened. Grade 2 strains include a lengthened ligament, due to the ligament being stretched or partially ruptured. With grade 2 strains there is still function, although the function may be  decreased. Grade 3 strains include a complete tear of the tendon or muscle, and function is usually impaired. SYMPTOMS   Pain around the shoulder, often at the outer portion of the upper arm.  Pain that gets worse with shoulder function, especially when reaching overhead or lifting.  Sometimes, aching when not using the arm.  Pain that wakes you up at night.  Sometimes, tenderness, swelling, warmth, or redness over the affected area.  Loss of strength.  Limited motion of the shoulder, especially reaching behind the back (to the back pocket or to unhook bra) or across your body.  Crackling sound (crepitation) when moving the arm.  Biceps tendon pain and inflammation (in the front of the shoulder). Worse when bending the elbow or lifting. CAUSES  Impingement syndrome is often an overuse injury, in which chronic (repetitive) motions cause the tendons or bursa to become inflamed. A strain occurs when a force is paced on the tendon or muscle that is greater than it can withstand. Common mechanisms of injury include: Stress from sudden increase in duration, frequency, or intensity of training.  Direct hit (trauma) to the shoulder.  Aging, erosion of the tendon with normal use.  Bony bump on shoulder (acromial spur). RISK INCREASES WITH:  Contact sports (football, wrestling, boxing).  Throwing sports (baseball, tennis, volleyball).  Weightlifting and  bodybuilding.  Heavy labor.  Previous injury to the rotator cuff, including impingement.  Poor shoulder strength and flexibility.  Failure to warm up properly before activity.  Inadequate protective equipment.  Old age.  Bony bump on shoulder (acromial spur). PREVENTION   Warm up and stretch properly before activity.  Allow for adequate recovery between workouts.  Maintain physical fitness:  Strength, flexibility, and endurance.  Cardiovascular fitness.  Learn and use proper exercise technique. PROGNOSIS  If treated properly, impingement syndrome usually goes away within 6 weeks. Sometimes surgery is required.  RELATED COMPLICATIONS   Longer healing time if not properly treated, or if not given enough time to heal.  Recurring symptoms, that result in a chronic condition.  Shoulder stiffness, frozen shoulder, or loss of motion.  Rotator cuff tendon tear.  Recurring symptoms, especially if activity is resumed too soon, with overuse, with a direct blow, or when using poor technique. TREATMENT  Treatment first involves the use of ice and medicine, to reduce pain and inflammation. The use of strengthening and stretching exercises may help reduce pain with activity. These exercises may be performed at home or with a therapist. If non-surgical treatment is unsuccessful after more than 6 months, surgery may be advised. After surgery and rehabilitation, activity is usually possible in 3 months.  MEDICATION  If pain medicine is needed, nonsteroidal anti-inflammatory medicines (aspirin and ibuprofen), or other minor pain relievers (acetaminophen), are often advised.  Do not take pain medicine for 7 days before surgery.  Prescription pain relievers may be given, if your caregiver thinks they are needed. Use only as directed and only as much as you need.  Corticosteroid injections may be given by your caregiver. These injections should be reserved for the most serious cases,  because they may only be given a certain number of times. HEAT AND COLD  Cold treatment (icing) should be applied for 10 to 15 minutes every 2 to 3 hours for inflammation and pain, and immediately after activity that aggravates your symptoms. Use ice packs or an ice massage.  Heat treatment may be used before performing stretching and strengthening activities prescribed by your caregiver, physical therapist, or  Event organiser. Use a heat pack or a warm water soak. SEEK MEDICAL CARE IF:   Symptoms get worse or do not improve in 4 to 6 weeks, despite treatment.  New, unexplained symptoms develop. (Drugs used in treatment may produce side effects.) EXERCISES  RANGE OF MOTION (ROM) AND STRETCHING EXERCISES - Impingement Syndrome (Rotator Cuff  Tendinitis, Bursitis) These exercises may help you when beginning to rehabilitate your injury. Your symptoms may go away with or without further involvement from your physician, physical therapist or athletic trainer. While completing these exercises, remember:   Restoring tissue flexibility helps normal motion to return to the joints. This allows healthier, less painful movement and activity.  An effective stretch should be held for at least 30 seconds.  A stretch should never be painful. You should only feel a gentle lengthening or release in the stretched tissue. STRETCH  Flexion, Standing  Stand with good posture. With an underhand grip on your right / left hand, and an overhand grip on the opposite hand, grasp a broomstick or cane so that your hands are a little more than shoulder width apart.  Keeping your right / left elbow straight and shoulder muscles relaxed, push the stick with your opposite hand, to raise your right / left arm in front of your body and then overhead. Raise your arm until you feel a stretch in your right / left shoulder, but before you have increased shoulder pain.  Try to avoid shrugging your right / left shoulder as your  arm rises, by keeping your shoulder blade tucked down and toward your mid-back spine. Hold for __________ seconds.  Slowly return to the starting position. Repeat __________ times. Complete this exercise __________ times per day. STRETCH  Abduction, Supine  Lie on your back. With an underhand grip on your right / left hand and an overhand grip on the opposite hand, grasp a broomstick or cane so that your hands are a little more than shoulder width apart.  Keeping your right / left elbow straight and your shoulder muscles relaxed, push the stick with your opposite hand, to raise your right / left arm out to the side of your body and then overhead. Raise your arm until you feel a stretch in your right / left shoulder, but before you have increased shoulder pain.  Try to avoid shrugging your right / left shoulder as your arm rises, by keeping your shoulder blade tucked down and toward your mid-back spine. Hold for __________ seconds.  Slowly return to the starting position. Repeat __________ times. Complete this exercise __________ times per day. ROM  Flexion, Active-Assisted  Lie on your back. You may bend your knees for comfort.  Grasp a broomstick or cane so your hands are about shoulder width apart. Your right / left hand should grip the end of the stick, so that your hand is positioned "thumbs-up," as if you were about to shake hands.  Using your healthy arm to lead, raise your right / left arm overhead, until you feel a gentle stretch in your shoulder. Hold for __________ seconds.  Use the stick to assist in returning your right / left arm to its starting position. Repeat __________ times. Complete this exercise __________ times per day.  ROM - Internal Rotation, Supine   Lie on your back on a firm surface. Place your right / left elbow about 60 degrees away from your side. Elevate your elbow with a folded towel, so that the elbow and shoulder are the same  height.  Using a broomstick  or cane and your strong arm, pull your right / left hand toward your body until you feel a gentle stretch, but no increase in your shoulder pain. Keep your shoulder and elbow in place throughout the exercise.  Hold for __________ seconds. Slowly return to the starting position. Repeat __________ times. Complete this exercise __________ times per day. STRETCH - Internal Rotation  Place your right / left hand behind your back, palm up.  Throw a towel or belt over your opposite shoulder. Grasp the towel with your right / left hand.  While keeping an upright posture, gently pull up on the towel, until you feel a stretch in the front of your right / left shoulder.  Avoid shrugging your right / left shoulder as your arm rises, by keeping your shoulder blade tucked down and toward your mid-back spine.  Hold for __________ seconds. Release the stretch, by lowering your healthy hand. Repeat __________ times. Complete this exercise __________ times per day. ROM - Internal Rotation   Using an underhand grip, grasp a stick behind your back with both hands.  While standing upright with good posture, slide the stick up your back until you feel a mild stretch in the front of your shoulder.  Hold for __________ seconds. Slowly return to your starting position. Repeat __________ times. Complete this exercise __________ times per day.  STRETCH  Posterior Shoulder Capsule   Stand or sit with good posture. Grasp your right / left elbow and draw it across your chest, keeping it at the same height as your shoulder.  Pull your elbow, so your upper arm comes in closer to your chest. Pull until you feel a gentle stretch in the back of your shoulder.  Hold for __________ seconds. Repeat __________ times. Complete this exercise __________ times per day. STRENGTHENING EXERCISES - Impingement Syndrome (Rotator Cuff Tendinitis, Bursitis) These exercises may help you when beginning to rehabilitate your injury.  They may resolve your symptoms with or without further involvement from your physician, physical therapist or athletic trainer. While completing these exercises, remember:  Muscles can gain both the endurance and the strength needed for everyday activities through controlled exercises.  Complete these exercises as instructed by your physician, physical therapist or athletic trainer. Increase the resistance and repetitions only as guided.  You may experience muscle soreness or fatigue, but the pain or discomfort you are trying to eliminate should never worsen during these exercises. If this pain does get worse, stop and make sure you are following the directions exactly. If the pain is still present after adjustments, discontinue the exercise until you can discuss the trouble with your clinician.  During your recovery, avoid activity or exercises which involve actions that place your injured hand or elbow above your head or behind your back or head. These positions stress the tissues which you are trying to heal. STRENGTH - Scapular Depression and Adduction   With good posture, sit on a firm chair. Support your arms in front of you, with pillows, arm rests, or on a table top. Have your elbows in line with the sides of your body.  Gently draw your shoulder blades down and toward your mid-back spine. Gradually increase the tension, without tensing the muscles along the top of your shoulders and the back of your neck.  Hold for __________ seconds. Slowly release the tension and relax your muscles completely before starting the next repetition.  After you have practiced this exercise, remove the arm  support and complete the exercise in standing as well as sitting position. Repeat __________ times. Complete this exercise __________ times per day.  STRENGTH - Shoulder Abductors, Isometric  With good posture, stand or sit about 4-6 inches from a wall, with your right / left side facing the wall.  Bend  your right / left elbow. Gently press your right / left elbow into the wall. Increase the pressure gradually, until you are pressing as hard as you can, without shrugging your shoulder or increasing any shoulder discomfort.  Hold for __________ seconds.  Release the tension slowly. Relax your shoulder muscles completely before you begin the next repetition. Repeat __________ times. Complete this exercise __________ times per day.  STRENGTH - External Rotators, Isometric  Keep your right / left elbow at your side and bend it 90 degrees.  Step into a door frame so that the outside of your right / left wrist can press against the door frame without your upper arm leaving your side.  Gently press your right / left wrist into the door frame, as if you were trying to swing the back of your hand away from your stomach. Gradually increase the tension, until you are pressing as hard as you can, without shrugging your shoulder or increasing any shoulder discomfort.  Hold for __________ seconds.  Release the tension slowly. Relax your shoulder muscles completely before you begin the next repetition. Repeat __________ times. Complete this exercise __________ times per day.  STRENGTH - Supraspinatus   Stand or sit with good posture. Grasp a __________ weight, or an exercise band or tubing, so that your hand is "thumbs-up," like you are shaking hands.  Slowly lift your right / left arm in a "V" away from your thigh, diagonally into the space between your side and straight ahead. Lift your hand to shoulder height or as far as you can, without increasing any shoulder pain. At first, many people do not lift their hands above shoulder height.  Avoid shrugging your right / left shoulder as your arm rises, by keeping your shoulder blade tucked down and toward your mid-back spine.  Hold for __________ seconds. Control the descent of your hand, as you slowly return to your starting position. Repeat __________  times. Complete this exercise __________ times per day.  STRENGTH - External Rotators  Secure a rubber exercise band or tubing to a fixed object (table, pole) so that it is at the same height as your right / left elbow when you are standing or sitting on a firm surface.  Stand or sit so that the secured exercise band is at your uninjured side.  Bend your right / left elbow 90 degrees. Place a folded towel or small pillow under your right / left arm, so that your elbow is a few inches away from your side.  Keeping the tension on the exercise band, pull it away from your body, as if pivoting on your elbow. Be sure to keep your body steady, so that the movement is coming only from your rotating shoulder.  Hold for __________ seconds. Release the tension in a controlled manner, as you return to the starting position. Repeat __________ times. Complete this exercise __________ times per day.  STRENGTH - Internal Rotators   Secure a rubber exercise band or tubing to a fixed object (table, pole) so that it is at the same height as your right / left elbow when you are standing or sitting on a firm surface.  Stand or sit  so that the secured exercise band is at your right / left side.  Bend your elbow 90 degrees. Place a folded towel or small pillow under your right / left arm so that your elbow is a few inches away from your side.  Keeping the tension on the exercise band, pull it across your body, toward your stomach. Be sure to keep your body steady, so that the movement is coming only from your rotating shoulder.  Hold for __________ seconds. Release the tension in a controlled manner, as you return to the starting position. Repeat __________ times. Complete this exercise __________ times per day.  STRENGTH - Scapular Protractors, Standing   Stand arms length away from a wall. Place your hands on the wall, keeping your elbows straight.  Begin by dropping your shoulder blades down and toward  your mid-back spine.  To strengthen your protractors, keep your shoulder blades down, but slide them forward on your rib cage. It will feel as if you are lifting the back of your rib cage away from the wall. This is a subtle motion and can be challenging to complete. Ask your caregiver for further instruction, if you are not sure you are doing the exercise correctly.  Hold for __________ seconds. Slowly return to the starting position, resting the muscles completely before starting the next repetition. Repeat __________ times. Complete this exercise __________ times per day. STRENGTH - Scapular Protractors, Supine  Lie on your back on a firm surface. Extend your right / left arm straight into the air while holding a __________ weight in your hand.  Keeping your head and back in place, lift your shoulder off the floor.  Hold for __________ seconds. Slowly return to the starting position, and allow your muscles to relax completely before starting the next repetition. Repeat __________ times. Complete this exercise __________ times per day. STRENGTH - Scapular Protractors, Quadruped  Get onto your hands and knees, with your shoulders directly over your hands (or as close as you can be, comfortably).  Keeping your elbows locked, lift the back of your rib cage up into your shoulder blades, so your mid-back rounds out. Keep your neck muscles relaxed.  Hold this position for __________ seconds. Slowly return to the starting position and allow your muscles to relax completely before starting the next repetition. Repeat __________ times. Complete this exercise __________ times per day.  STRENGTH - Scapular Retractors  Secure a rubber exercise band or tubing to a fixed object (table, pole), so that it is at the height of your shoulders when you are either standing, or sitting on a firm armless chair.  With a palm down grip, grasp an end of the band in each hand. Straighten your elbows and lift your  hands straight in front of you, at shoulder height. Step back, away from the secured end of the band, until it becomes tense.  Squeezing your shoulder blades together, draw your elbows back toward your sides, as you bend them. Keep your upper arms lifted away from your body throughout the exercise.  Hold for __________ seconds. Slowly ease the tension on the band, as you reverse the directions and return to the starting position. Repeat __________ times. Complete this exercise __________ times per day. STRENGTH - Shoulder Extensors   Secure a rubber exercise band or tubing to a fixed object (table, pole) so that it is at the height of your shoulders when you are either standing, or sitting on a firm armless chair.  With a  thumbs-up grip, grasp an end of the band in each hand. Straighten your elbows and lift your hands straight in front of you, at shoulder height. Step back, away from the secured end of the band, until it becomes tense.  Squeezing your shoulder blades together, pull your hands down to the sides of your thighs. Do not allow your hands to go behind you.  Hold for __________ seconds. Slowly ease the tension on the band, as you reverse the directions and return to the starting position. Repeat __________ times. Complete this exercise __________ times per day.  STRENGTH - Scapular Retractors and External Rotators   Secure a rubber exercise band or tubing to a fixed object (table, pole) so that it is at the height as your shoulders, when you are either standing, or sitting on a firm armless chair.  With a palm down grip, grasp an end of the band in each hand. Bend your elbows 90 degrees and lift your elbows to shoulder height, at your sides. Step back, away from the secured end of the band, until it becomes tense.  Squeezing your shoulder blades together, rotate your shoulders so that your upper arms and elbows remain stationary, but your fists travel upward to head height.  Hold  for __________ seconds. Slowly ease the tension on the band, as you reverse the directions and return to the starting position. Repeat __________ times. Complete this exercise __________ times per day.  STRENGTH - Scapular Retractors and External Rotators, Rowing   Secure a rubber exercise band or tubing to a fixed object (table, pole) so that it is at the height of your shoulders, when you are either standing, or sitting on a firm armless chair.  With a palm down grip, grasp an end of the band in each hand. Straighten your elbows and lift your hands straight in front of you, at shoulder height. Step back, away from the secured end of the band, until it becomes tense.  Step 1: Squeeze your shoulder blades together. Bending your elbows, draw your hands to your chest, as if you are rowing a boat. At the end of this motion, your hands and elbow should be at shoulder height and your elbows should be out to your sides.  Step 2: Rotate your shoulders, to raise your hands above your head. Your forearms should be vertical and your upper arms should be horizontal.  Hold for __________ seconds. Slowly ease the tension on the band, as you reverse the directions and return to the starting position. Repeat __________ times. Complete this exercise __________ times per day.  STRENGTH  Scapular Depressors  Find a sturdy chair without wheels, such as a dining room chair.  Keeping your feet on the floor, and your hands on the chair arms, lift your bottom up from the seat, and lock your elbows.  Keeping your elbows straight, allow gravity to pull your body weight down. Your shoulders will rise toward your ears.  Raise your body against gravity by drawing your shoulder blades down your back, shortening the distance between your shoulders and ears. Although your feet should always maintain contact with the floor, your feet should progressively support less body weight, as you get stronger.  Hold for __________  seconds. In a controlled and slow manner, lower your body weight to begin the next repetition. Repeat __________ times. Complete this exercise __________ times per day.  Document Released: 04/28/2005 Document Revised: 07/21/2011 Document Reviewed: 08/10/2008 [facility] Patient Information 2014 [place], [place].  t

## 2012-11-05 LAB — GC/CHLAMYDIA PROBE AMP
CT Probe RNA: NEGATIVE
GC Probe RNA: NEGATIVE

## 2012-11-09 ENCOUNTER — Telehealth: Payer: Self-pay

## 2012-11-09 NOTE — Telephone Encounter (Signed)
Pt is calling about lab results   Best number [phone]

## 2012-11-09 NOTE — Telephone Encounter (Signed)
Called him/ see labs.

## 2013-03-25 ENCOUNTER — Telehealth: Payer: Self-pay | Admitting: Family Medicine

## 2013-03-25 ENCOUNTER — Other Ambulatory Visit: Payer: Self-pay | Admitting: Family Medicine

## 2013-03-25 DIAGNOSIS — E7800 Pure hypercholesterolemia, unspecified: Secondary | ICD-10-CM

## 2013-03-25 DIAGNOSIS — Z0000 Encounter for general adult medical examination without abnormal findings: Secondary | ICD-10-CM

## 2013-03-25 NOTE — Telephone Encounter (Signed)
I put future orders in for the Lipid panel

## 2013-03-25 NOTE — Telephone Encounter (Signed)
George Aguilar need to have an order entered for fasting lipid panel before appt with provider on 11/20.  Want to have this done asap.  Please enter info and inform Babs when completed

## 2013-03-29 ENCOUNTER — Other Ambulatory Visit: Payer: BC Managed Care – PPO

## 2013-03-29 DIAGNOSIS — E7800 Pure hypercholesterolemia, unspecified: Secondary | ICD-10-CM

## 2013-03-29 DIAGNOSIS — Z0000 Encounter for general adult medical examination without abnormal findings: Secondary | ICD-10-CM

## 2013-03-29 LAB — COMPREHENSIVE METABOLIC PANEL
AST: 21 U/L (ref 0–37)
Albumin: 4.4 g/dL (ref 3.5–5.2)
Alkaline Phosphatase: 32 U/L — ABNORMAL LOW (ref 39–117)
CO2: 27 mEq/L (ref 19–32)
Calcium: 9.4 mg/dL (ref 8.4–10.5)
Sodium: 137 mEq/L (ref 135–145)
Total Bilirubin: 0.9 mg/dL (ref 0.3–1.2)
Total Protein: 7.2 g/dL (ref 6.0–8.3)

## 2013-03-29 LAB — CBC WITH DIFFERENTIAL/PLATELET
HCT: 44.2 % (ref 39.0–52.0)
Hemoglobin: 15.9 g/dL (ref 13.0–17.0)
Lymphocytes Relative: 50 % — ABNORMAL HIGH (ref 12–46)
MCH: 31.3 pg (ref 26.0–34.0)
MCV: 87 fL (ref 78.0–100.0)
Monocytes Relative: 7 % (ref 3–12)
RDW: 13 % (ref 11.5–15.5)
WBC: 4.1 10*3/uL (ref 4.0–10.5)

## 2013-03-29 LAB — LIPID PANEL
LDL Cholesterol: 159 mg/dL — ABNORMAL HIGH (ref 0–99)
Triglycerides: 118 mg/dL (ref <150)
VLDL: 24 mg/dL (ref 0–40)

## 2013-03-29 NOTE — Progress Notes (Signed)
CMP,FLP AND CBC WITH DIFF DONE TODAY George Aguilar

## 2013-03-31 ENCOUNTER — Encounter: Payer: Self-pay | Admitting: Family Medicine

## 2013-03-31 ENCOUNTER — Ambulatory Visit (INDEPENDENT_AMBULATORY_CARE_PROVIDER_SITE_OTHER): Payer: BC Managed Care – PPO | Admitting: Family Medicine

## 2013-03-31 VITALS — BP 126/69 | HR 73 | Temp 99.0°F | Ht 67.0 in | Wt 188.0 lb

## 2013-03-31 DIAGNOSIS — E7800 Pure hypercholesterolemia, unspecified: Secondary | ICD-10-CM

## 2013-03-31 DIAGNOSIS — Z0000 Encounter for general adult medical examination without abnormal findings: Secondary | ICD-10-CM

## 2013-03-31 MED ORDER — ATORVASTATIN CALCIUM 10 MG PO TABS: 10.0000 mg | ORAL_TABLET | Freq: Every day | ORAL | Status: DC

## 2013-03-31 NOTE — Patient Instructions (Addendum)
George Aguilar, it was a pleasure meeting you today. We spoke about your high cholesterol and follow-up issues.  High cholesterol: we will start atorvastatin 10mg  daily to better control your cholesterol. You have been doing a great job with diet and exercise modification, though. You can also take Omega-3 fatty acids (also found in fish oil). Please call me if you have side effects with the new medication, such as body or joint aches.  Health maintenance: your labs were great except for your high cholesterol. Today we got blood for an A1C and PSA. If anything is abnormal, I will give you a call.  If you have any questions, please do not hesitate to call. Please see me in 1 year unless something acute happens.  Sincerely,  Jacquelin Hawking, MD

## 2013-03-31 NOTE — Assessment & Plan Note (Signed)
Patient has high LDL and total cholesterol. Was on statin therapy before but did not tolerate. Has been trying to modify diet and exercise to prevent needing drugs. Prescribed lipitor 10mg  today

## 2013-03-31 NOTE — Progress Notes (Signed)
Subjective:    Patient ID: George Aguilar, male    DOB: 1955-08-15, 57 y.o.   MRN: [id number]  Hyperlipidemia This is a chronic problem. The problem is uncontrolled. Recent lipid tests were reviewed and are high. He has no history of diabetes. Pertinent negatives include no chest pain, myalgias or shortness of breath. Current antihyperlipidemic treatment includes exercise and diet change. The current treatment provides mild improvement of lipids.      Review of Systems  Respiratory: Negative for shortness of breath.   Cardiovascular: Negative for chest pain.  Musculoskeletal: Negative for myalgias.  All other systems reviewed and are negative.       Objective:   Physical Exam  Vitals reviewed. Constitutional: He is oriented to person, place, and time. He appears well-developed and well-nourished.  HENT:  Head: Normocephalic.  Right Ear: External ear normal.  Left Ear: External ear normal.  Nose: Nose normal.  Mouth/Throat: Oropharynx is clear and moist and mucous membranes are normal. He has dentures. No oropharyngeal exudate.  Eyes: Conjunctivae and EOM are normal. Pupils are equal, round, and reactive to light. No scleral icterus.  Neck: Normal range of motion. Neck supple.  Cardiovascular: Normal rate, regular rhythm, normal heart sounds and intact distal pulses.   No murmur heard. Pulmonary/Chest: Effort normal and breath sounds normal. No respiratory distress. He has no wheezes.  Abdominal: Soft. Bowel sounds are normal. He exhibits no distension. There is no tenderness. There is no rebound.  Musculoskeletal: Normal range of motion. He exhibits no edema and no tenderness.  Neurological: He is alert and oriented to person, place, and time. He has normal reflexes.  Skin: Skin is warm and dry. No erythema.   Recent Results (from the past 2160 hour(s))  LIPID PANEL     Status: Abnormal   Collection Time    03/29/13  8:32 AM      Result Value Range   Cholesterol 231 (*)  0 - 200 mg/dL   Comment: ATP III Classification:           < 200        mg/dL        Desirable          200 - 239     mg/dL        Borderline High          >= 240        mg/dL        High         Triglycerides 118  <150 mg/dL   HDL 48  >11 mg/dL   Total CHOL/HDL Ratio 4.8     VLDL 24  0 - 40 mg/dL   LDL Cholesterol 914 (*) 0 - 99 mg/dL        CBC WITH DIFFERENTIAL     Status: Abnormal   Collection Time    03/29/13  8:32 AM      Result Value Range   WBC 4.1  4.0 - 10.5 K/uL   RBC 5.08  4.22 - 5.81 MIL/uL   Hemoglobin 15.9  13.0 - 17.0 g/dL   HCT 78.2  95.6 - 21.3 %   MCV 87.0  78.0 - 100.0 fL   MCH 31.3  26.0 - 34.0 pg   MCHC 36.0  30.0 - 36.0 g/dL   RDW 08.6  57.8 - 46.9 %   Platelets 234  150 - 400 K/uL   Neutrophils Relative % 39 (*) 43 - 77 %  Neutro Abs 1.6 (*) 1.7 - 7.7 K/uL   Lymphocytes Relative 50 (*) 12 - 46 %   Lymphs Abs 2.0  0.7 - 4.0 K/uL   Monocytes Relative 7  3 - 12 %   Monocytes Absolute 0.3  0.1 - 1.0 K/uL   Eosinophils Relative 3  0 - 5 %   Eosinophils Absolute 0.1  0.0 - 0.7 K/uL   Basophils Relative 1  0 - 1 %   Basophils Absolute 0.0  0.0 - 0.1 K/uL   Smear Review Criteria for review not met    COMPREHENSIVE METABOLIC PANEL     Status: Abnormal   Collection Time    03/29/13  8:32 AM      Result Value Range   Sodium 137  135 - 145 mEq/L   Potassium 4.0  3.5 - 5.3 mEq/L   Chloride 100  96 - 112 mEq/L   CO2 27  19 - 32 mEq/L   Glucose, Bld 100 (*) 70 - 99 mg/dL   BUN 12  6 - 23 mg/dL   Creat 1.61  0.96 - 0.45 mg/dL   Total Bilirubin 0.9  0.3 - 1.2 mg/dL   Alkaline Phosphatase 32 (*) 39 - 117 U/L   AST 21  0 - 37 U/L   ALT 32  0 - 53 U/L   Total Protein 7.2  6.0 - 8.3 g/dL   Albumin 4.4  3.5 - 5.2 g/dL   Calcium 9.4  8.4 - 40.9 mg/dL  POCT GLYCOSYLATED HEMOGLOBIN (HGB A1C)     Status: None   Collection Time    03/31/13  3:04 PM      Result Value Range   Hemoglobin A1C 5.5           Assessment & Plan:

## 2013-04-01 LAB — PSA: PSA: 0.44 ng/mL (ref <=4.00)

## 2014-02-14 ENCOUNTER — Ambulatory Visit (INDEPENDENT_AMBULATORY_CARE_PROVIDER_SITE_OTHER): Payer: BC Managed Care – PPO | Admitting: Family Medicine

## 2014-02-14 VITALS — BP 120/70 | HR 78 | Temp 98.3°F | Resp 16 | Ht 69.0 in | Wt 189.4 lb

## 2014-02-14 DIAGNOSIS — N50812 Left testicular pain: Secondary | ICD-10-CM

## 2014-02-14 DIAGNOSIS — N508 Other specified disorders of male genital organs: Secondary | ICD-10-CM

## 2014-02-14 DIAGNOSIS — R350 Frequency of micturition: Secondary | ICD-10-CM

## 2014-02-14 DIAGNOSIS — R1032 Left lower quadrant pain: Secondary | ICD-10-CM

## 2014-02-14 LAB — POCT CBC
GRANULOCYTE PERCENT: 49 % (ref 37–80)
HEMATOCRIT: 49.7 % (ref 43.5–53.7)
HEMOGLOBIN: 16.6 g/dL (ref 14.1–18.1)
LYMPH, POC: 2.3 (ref 0.6–3.4)
MCH: 31.5 pg — AB (ref 27–31.2)
MCHC: 33.4 g/dL (ref 31.8–35.4)
MCV: 94.4 fL (ref 80–97)
MID (cbc): 0.2 (ref 0–0.9)
MPV: 7 fL (ref 0–99.8)
PLATELET COUNT, POC: 265 10*3/uL (ref 142–424)
POC GRANULOCYTE: 2.5 (ref 2–6.9)
POC LYMPH PERCENT: 47 %L (ref 10–50)
POC MID %: 4 % (ref 0–12)
RBC: 5.26 M/uL (ref 4.69–6.13)
RDW, POC: 12.5 %
WBC: 5 10*3/uL (ref 4.6–10.2)

## 2014-02-14 LAB — POCT UA - MICROSCOPIC ONLY
BACTERIA, U MICROSCOPIC: NEGATIVE
CASTS, UR, LPF, POC: NEGATIVE
CRYSTALS, UR, HPF, POC: NEGATIVE
EPITHELIAL CELLS, URINE PER MICROSCOPY: NEGATIVE
Mucus, UA: NEGATIVE
RBC, URINE, MICROSCOPIC: NEGATIVE
WBC, Ur, HPF, POC: NEGATIVE
Yeast, UA: NEGATIVE

## 2014-02-14 LAB — POCT URINALYSIS DIPSTICK
BILIRUBIN UA: NEGATIVE
GLUCOSE UA: NEGATIVE
KETONES UA: NEGATIVE
LEUKOCYTES UA: NEGATIVE
Nitrite, UA: NEGATIVE
PH UA: 5.5
PROTEIN UA: NEGATIVE
RBC UA: NEGATIVE
SPEC GRAV UA: 1.01
UROBILINOGEN UA: 0.2

## 2014-02-14 LAB — IFOBT (OCCULT BLOOD): IMMUNOLOGICAL FECAL OCCULT BLOOD TEST: NEGATIVE

## 2014-02-14 MED ORDER — CIPROFLOXACIN HCL 500 MG PO TABS: 500.0000 mg | ORAL_TABLET | Freq: Two times a day (BID) | ORAL | Status: DC

## 2014-02-14 NOTE — Progress Notes (Signed)
Subjective: 58 year old man who is here for low abdominal pain. He has had pains intermittently since Friday. It is in the low abdomen, a little more on the lateral right, radiating down into the left testicle. No discharge there is a little mucus in his urine. He has had Chlamydia years ago. He does have a fairly recent sexual partner. He has been separated from his wife. 2 sexual partners within the last year. STD testing was negative a year ago. He wears condoms but does not trust them. No history of prostate disease. PSA was normal last year. No dysuria but he has had urinary frequency and nocturia. Hurt yesterday he was fine during the night and then pain badly this morning about 6 on a scale of 10. [place].  Objective: Pleasant gentleman in no acute distress. No CVA tenderness. Abdomen has normal bowel sounds. Soft without organomegaly or masses. Minimal left lower quadrant tenderness. Normal male external genitalia with testes descended. No tenderness. Penis normal. Normal male genitalia. No hernias. Digital rectal exam reveals prostate gland B. normal in contour. Not excessively soft.  Assessment: Low abdominal pain, etiology undetermined  Plan: CBC, urinalysis, STD testing  Results for orders placed in visit on 02/14/14  IFOBT (OCCULT BLOOD)      Result Value Ref Range   IFOBT Negative    POCT CBC      Result Value Ref Range   WBC 5.0  4.6 - 10.2 K/uL   Lymph, poc 2.3  0.6 - 3.4   POC LYMPH PERCENT 47.0  10 - 50 %L   MID (cbc) 0.2  0 - 0.9   POC MID % 4.0  0 - 12 %M   POC Granulocyte 2.5  2 - 6.9   Granulocyte percent 49.0  37 - 80 %G   RBC 5.26  4.69 - 6.13 M/uL   Hemoglobin 16.6  14.1 - 18.1 g/dL   HCT, POC 54.049.7  98.143.5 - 53.7 %   MCV 94.4  80 - 97 fL   MCH, POC 31.5 (*) 27 - 31.2 pg   MCHC 33.4  31.8 - 35.4 g/dL   RDW, POC 19.112.5     Platelet Count, POC 265  142 - 424 K/uL   MPV 7.0  0 - 99.8 fL  POCT UA - MICROSCOPIC ONLY      Result Value Ref  Range   WBC, Ur, HPF, POC neg     RBC, urine, microscopic neg     Bacteria, U Microscopic neg     Mucus, UA neg     Epithelial cells, urine per micros neg     Crystals, Ur, HPF, POC neg     Casts, Ur, LPF, POC neg     Yeast, UA neg    POCT URINALYSIS DIPSTICK      Result Value Ref Range   Color, UA yellow     Clarity, UA clear     Glucose, UA neg     Bilirubin, UA neg     Ketones, UA neg     Spec Grav, UA 1.010     Blood, UA neg     pH, UA 5.5     Protein, UA neg     Urobilinogen, UA 0.2     Nitrite, UA neg     Leukocytes, UA Negative     Possibility of low-grade epididymitis or diverticulitis. Did go treated with some Cipro. [place] tests are pending.

## 2014-02-14 NOTE — Patient Instructions (Signed)
Take Aleve 2 pills twice daily  Take Cipro one twice daily  If you do not hear the results of the STD tests by Monday call back  Return at any time if worse.

## 2014-02-15 LAB — HIV ANTIBODY (ROUTINE TESTING W REFLEX): HIV 1&2 Ab, 4th Generation: NONREACTIVE

## 2014-02-15 LAB — RPR

## 2014-02-16 LAB — GC/CHLAMYDIA PROBE AMP
CT Probe RNA: NEGATIVE
GC Probe RNA: NEGATIVE

## 2014-04-19 ENCOUNTER — Encounter: Payer: BC Managed Care – PPO | Admitting: Family Medicine

## 2014-04-26 ENCOUNTER — Ambulatory Visit (INDEPENDENT_AMBULATORY_CARE_PROVIDER_SITE_OTHER): Payer: BC Managed Care – PPO | Admitting: Family Medicine

## 2014-04-26 ENCOUNTER — Encounter: Payer: Self-pay | Admitting: Family Medicine

## 2014-04-26 VITALS — BP 133/78 | HR 76 | Temp 98.0°F | Ht 69.0 in | Wt 193.0 lb

## 2014-04-26 DIAGNOSIS — E780 Pure hypercholesterolemia: Secondary | ICD-10-CM

## 2014-04-26 DIAGNOSIS — E785 Hyperlipidemia, unspecified: Secondary | ICD-10-CM | POA: Diagnosis not present

## 2014-04-26 DIAGNOSIS — N529 Male erectile dysfunction, unspecified: Secondary | ICD-10-CM

## 2014-04-26 DIAGNOSIS — Z0000 Encounter for general adult medical examination without abnormal findings: Secondary | ICD-10-CM | POA: Diagnosis not present

## 2014-04-26 DIAGNOSIS — Z23 Encounter for immunization: Secondary | ICD-10-CM | POA: Diagnosis not present

## 2014-04-26 DIAGNOSIS — Z131 Encounter for screening for diabetes mellitus: Secondary | ICD-10-CM | POA: Diagnosis not present

## 2014-04-26 LAB — COMPLETE METABOLIC PANEL WITH GFR
ALK PHOS: 35 U/L — AB (ref 39–117)
ALT: 35 U/L (ref 0–53)
AST: 26 U/L (ref 0–37)
Albumin: 4.3 g/dL (ref 3.5–5.2)
BUN: 11 mg/dL (ref 6–23)
CALCIUM: 9.3 mg/dL (ref 8.4–10.5)
CO2: 29 meq/L (ref 19–32)
Chloride: 101 mEq/L (ref 96–112)
Creat: 0.97 mg/dL (ref 0.50–1.35)
GFR, EST NON AFRICAN AMERICAN: 86 mL/min
GFR, Est African American: >89 mL/min
GLUCOSE: 104 mg/dL — AB (ref 70–99)
Potassium: 4.1 mEq/L (ref 3.5–5.3)
SODIUM: 137 meq/L (ref 135–145)
Total Bilirubin: 0.6 mg/dL (ref 0.2–1.2)
Total Protein: 7.4 g/dL (ref 6.0–8.3)

## 2014-04-26 LAB — CBC WITH DIFFERENTIAL/PLATELET
BASOS ABS: 0 10*3/uL (ref 0.0–0.1)
BASOS PCT: 0 % (ref 0–1)
EOS ABS: 0.1 10*3/uL (ref 0.0–0.7)
EOS PCT: 4 % (ref 0–5)
HEMATOCRIT: 44.1 % (ref 39.0–52.0)
HEMOGLOBIN: 15.7 g/dL (ref 13.0–17.0)
LYMPHS ABS: 1.6 10*3/uL (ref 0.7–4.0)
LYMPHS PCT: 47 % — AB (ref 12–46)
MCH: 31.8 pg (ref 26.0–34.0)
MCHC: 35.6 g/dL (ref 30.0–36.0)
MCV: 89.5 fL (ref 78.0–100.0)
MPV: 9.6 fL (ref 9.4–12.4)
Monocytes Absolute: 0.2 10*3/uL (ref 0.1–1.0)
Monocytes Relative: 6 % (ref 3–12)
NEUTROS ABS: 1.4 10*3/uL — AB (ref 1.7–7.7)
NEUTROS PCT: 43 % (ref 43–77)
PLATELETS: 241 10*3/uL (ref 150–400)
RBC: 4.93 MIL/uL (ref 4.22–5.81)
RDW: 12.9 % (ref 11.5–15.5)
WBC: 3.3 10*3/uL — ABNORMAL LOW (ref 4.0–10.5)

## 2014-04-26 LAB — LIPID PANEL
CHOL/HDL RATIO: 5 ratio
CHOLESTEROL: 217 mg/dL — AB (ref 0–200)
HDL: 43 mg/dL (ref >39)
LDL Cholesterol: 142 mg/dL — ABNORMAL HIGH (ref 0–99)
Triglycerides: 161 mg/dL — ABNORMAL HIGH (ref <150)
VLDL: 32 mg/dL (ref 0–40)

## 2014-04-26 LAB — POCT GLYCOSYLATED HEMOGLOBIN (HGB A1C): Hemoglobin A1C: 5.3

## 2014-04-26 MED ORDER — SILDENAFIL CITRATE 50 MG PO TABS: 50.0000 mg | ORAL_TABLET | Freq: Every day | ORAL | Status: DC | PRN

## 2014-04-26 NOTE — Assessment & Plan Note (Addendum)
Patient with normal physical exam.  Sildenafil 50mg  before sexual encounter  Discussed red flags with patient  AM testosterone level

## 2014-04-26 NOTE — Progress Notes (Signed)
    Subjective    Horald PollenGeorge W Stogdill is a 58 y.o. male that presents for an office visit.   1. Hyperlipidemia: He is not using a statin and prefers not to use one. He has been eating rolled oats every day in an attempt to lower his cholesterol. He does not eat fried food except on Thanksgiving. No chest pain, shortness of breath.  3. Erectile dysfunction: Symptoms started about 1 year ago but he was hoping things would improve. He has been exercising more in hopes that this would help, but it has not helped. No trauma to his penis. He is able to start an erection but is unable to maintain. No recent history of STI.  Past medical history: Reviewed and updated  Family history: Reviewed and updated  Social history: Reviewed and updated  History  Substance Use Topics  . Smoking status: Never Smoker   . Smokeless tobacco: Not on file  . Alcohol Use: No    No Known Allergies  Meds ordered this encounter  Medications  . sildenafil (VIAGRA) 50 MG tablet    Sig: Take 1 tablet (50 mg total) by mouth daily as needed for erectile dysfunction.    Dispense:  20 tablet    Refill:  0    ROS  Per HPI   Objective   BP 133/78 mmHg  Pulse 76  Temp(Src) 98 F (36.7 C) (Oral)  Ht 5\' 9"  (1.753 m)  Wt 193 lb (87.544 kg)  BMI 28.49 kg/m2  SpO2 99%  General: Well appearing male in no distress HEENT: PERRL, moist mucous membranes, no oral ulcers Respiratory/Chest: Clear to auscultation bilaterally Cardiovascular: Regular rate and rhythm without murmur Gastrointestinal: Soft, non-tender, non-distended, bowel sounds present Genitourinary: Normal appearing circumcised penis without evidence of trauma. Testicles palpated bilaterally without tenderness    Musculoskeletal: Moves spontaneously. No weakness appreciated. Neuro: Reflexed 2+. CN grossly intact  Assessment and Plan   Please refer to problem based charting of assessment and plan

## 2014-04-26 NOTE — Patient Instructions (Signed)
Thank you for coming to see me today. It was a pleasure.  - You will get your test results in the mail  Please make an appointment to see me in one year for follow-up.  If you have any questions or concerns, please do not hesitate to call the office at [phone].  Sincerely,  Jacquelin Hawkingalph Nettey, MD   Sildenafil tablets (Viagra) What is this medicine? SILDENAFIL (sil DEN a fil) is used to treat erection problems in men. This medicine may be used for other purposes; ask your health care provider or pharmacist if you have questions. COMMON BRAND NAME(S): Viagra What should I tell my health care provider before I take this medicine? They need to know if you have any of these conditions: -bleeding disorders -eye or vision problems, including a rare inherited eye disease called retinitis pigmentosa -anatomical deformation of the penis, Peyronie's disease, or history of priapism (painful and prolonged erection) -heart disease, angina, a history of heart attack, irregular heart beats, or other heart problems -high or low blood pressure -history of blood diseases, like sickle cell anemia or leukemia -history of stomach bleeding -kidney disease -liver disease -stroke -an unusual or allergic reaction to sildenafil, other medicines, foods, dyes, or preservatives -pregnant or trying to get pregnant -breast-feeding How should I use this medicine? Take this medicine by mouth with a glass of water. Follow the directions on the prescription label. The dose is usually taken 1 hour before sexual activity. You should not take the dose more than once per day. Do not take your medicine more often than directed. Talk to your pediatrician regarding the use of this medicine in children. This medicine is not used in children for this condition. Overdosage: If you think you have taken too much of this medicine contact a poison control center or emergency room at once. NOTE: This medicine is only for you. Do  not share this medicine with others. What if I miss a dose? This does not apply. Do not take double or extra doses. What may interact with this medicine? Do not take this medicine with any of the following medications: -cisapride -methscopolamine nitrate -nitrates like amyl nitrite, isosorbide dinitrate, isosorbide mononitrate, nitroglycerin -nitroprusside -other medicines for erectile dysfunction like avanafil, tadalafil, vardenafil -other sildenafil products (Revatio) This medicine may also interact with the following medications: -certain drugs for high blood pressure -certain drugs for the treatment of HIV infection or AIDS -certain drugs used for fungal or yeast infections, like fluconazole, itraconazole, ketoconazole, and voriconazole -cimetidine -erythromycin -rifampin This list may not describe all possible interactions. Give your health care provider a list of all the medicines, herbs, non-prescription drugs, or dietary supplements you use. Also tell them if you smoke, drink alcohol, or use illegal drugs. Some items may interact with your medicine. What should I watch for while using this medicine? If you notice any changes in your vision while taking this drug, call your doctor or health care professional as soon as possible. Stop using this medicine and call your health care provider right away if you have a loss of sight in one or both eyes. Contact your doctor or health care professional right away if you have an erection that lasts longer than 4 hours or if it becomes painful. This may be a sign of a serious problem and must be treated right away to prevent permanent damage. If you experience symptoms of nausea, dizziness, chest pain or arm pain upon initiation of sexual activity after taking this medicine,  you should refrain from further activity and call your doctor or health care professional as soon as possible. Do not drink alcohol to excess (examples, 5 glasses of wine or 5  shots of whiskey) when taking this medicine. When taken in excess, alcohol can increase your chances of getting a headache or getting dizzy, increasing your heart rate or lowering your blood pressure. Using this medicine does not protect you or your partner against HIV infection (the virus that causes AIDS) or other sexually transmitted diseases. What side effects may I notice from receiving this medicine? Side effects that you should report to your doctor or health care professional as soon as possible: -allergic reactions like skin rash, itching or hives, swelling of the face, lips, or tongue -breathing problems -changes in hearing -changes in vision -chest pain -fast, irregular heartbeat -prolonged or painful erection -seizures Side effects that usually do not require medical attention (report to your doctor or health care professional if they continue or are bothersome): -back pain -dizziness -flushing -headache -indigestion -muscle aches -nausea -stuffy or runny nose This list may not describe all possible side effects. Call your doctor for medical advice about side effects. You may report side effects to FDA at 1-800-FDA-1088. Where should I keep my medicine? Keep out of reach of children. Store at room temperature between 15 and 30 degrees C (59 and 86 degrees F). Throw away any unused medicine after the expiration date. NOTE: This sheet is a summary. It may not cover all possible information. If you have questions about this medicine, talk to your doctor, pharmacist, or health care provider.  2015, [employer] Standard. (2012-04-28 12:43:54)

## 2014-04-26 NOTE — Assessment & Plan Note (Signed)
Patient trying diet modification. ASCVD risk of 8.2% using last year's numbers, qualifying for moderate-high intensity statin. Because patient is against starting statin, will hold off. If LDL still high, patient willing to start statin.  Follow-up lipid panel  Will consider starting statin based on results and discussion with patient.

## 2014-04-27 ENCOUNTER — Encounter: Payer: Self-pay | Admitting: Family Medicine

## 2014-04-27 LAB — TESTOSTERONE: Testosterone: 463 ng/dL (ref 300–890)

## 2015-04-16 ENCOUNTER — Encounter: Payer: Self-pay | Admitting: Family Medicine

## 2015-04-16 ENCOUNTER — Ambulatory Visit (INDEPENDENT_AMBULATORY_CARE_PROVIDER_SITE_OTHER): Payer: 59 | Admitting: Family Medicine

## 2015-04-16 VITALS — BP 131/73 | HR 78 | Temp 98.1°F | Ht 69.0 in | Wt 182.1 lb

## 2015-04-16 DIAGNOSIS — Z0000 Encounter for general adult medical examination without abnormal findings: Secondary | ICD-10-CM

## 2015-04-16 DIAGNOSIS — M25511 Pain in right shoulder: Secondary | ICD-10-CM

## 2015-04-16 DIAGNOSIS — E7800 Pure hypercholesterolemia, unspecified: Secondary | ICD-10-CM

## 2015-04-16 DIAGNOSIS — Z1159 Encounter for screening for other viral diseases: Secondary | ICD-10-CM

## 2015-04-16 DIAGNOSIS — N529 Male erectile dysfunction, unspecified: Secondary | ICD-10-CM

## 2015-04-16 MED ORDER — MELOXICAM 15 MG PO TABS: 15.0000 mg | ORAL_TABLET | Freq: Every day | ORAL | Status: DC

## 2015-04-16 NOTE — Patient Instructions (Signed)
Thank you for coming to see me today. It was a pleasure. Today we talked about:   Shoulder pain: I think this might be some inflammation of one of your tendons. We can try some antiinflammatory medication. I will give you some exercises to perform.  Labs: I will send you your labs in the mail.  Please make an appointment to see me in 1 year or sooner as needed for follow-up and yearly physical.  If you have any questions or concerns, please do not hesitate to call the office at [phone]. [phone] Sincerely,  Jacquelin Hawkingalph Platon Arocho, MD

## 2015-04-16 NOTE — Progress Notes (Signed)
Subjective    George Aguilar is a 59 y.o. male that presents for yearly physical exam.   Concerns:  1. Right shoulder pain: Symptoms started about two weeks ago. No associated injury. Pain is worse at night when lying down and when working throughout the day. Pain is located on the top of his shoulder. No weakness, numbness or tingling. He works with tools and does lifting. Tools require rotational movements. He has not tried anything for his shoulder.  Goals    None      History reviewed. No pertinent past medical history.  History reviewed. No pertinent past surgical history.  Current Outpatient Prescriptions on File Prior to Visit  Medication Sig Dispense Refill  . sildenafil (VIAGRA) 50 MG tablet Take 1 tablet (50 mg total) by mouth daily as needed for erectile dysfunction. 20 tablet 0   No current facility-administered medications on file prior to visit.    No Known Allergies  Social History   Social History  . Marital Status: Married    Spouse Name: N/A  . Number of Children: N/A  . Years of Education: N/A   Occupational History  . maintenance     Social History Main Topics  . Smoking status: Never Smoker   . Smokeless tobacco: None  . Alcohol Use: 4.8 oz/week    8 Cans of beer per week  . Drug Use: No  . Sexual Activity: Yes    Birth Control/ Protection: Condom     Comment: Women   Other Topics Concern  . None   Social History Narrative    Family History  Problem Relation Age of Onset  . Diabetes Mother   . Ovarian cancer Mother   . Diabetes Sister   . Diabetes Brother     ROS  Per HPI   Objective   BP 131/73 mmHg  Pulse 78  Temp(Src) 98.1 F (36.7 C) (Oral)  Ht  (1.753 m)  Wt 182 lb 1.6 oz (82.6 kg)  BMI 26.88 kg/m2  General: Well appearing, no distress HEENT:   Head:  Normocephalic  Eyes: Pupils equal and reactive to light/accomodation. Extraocular movements intact bilaterally.  Ears: Tympanic membranes normal  bilaterally.  Nose/Throat: Nares patent bilaterally. Oropharnx clear and moist.  Neck: No cervical adenopathy bilaterally Respiratory/Chest: Clear to auscultation bilaterally. Unlabored work of breathing. No wheezing or rales. Cardiovascular: Regular rate and rhythm. Normal S1 and S2. No heart murmurs present. No extra heart sounds Gastrointestinal: Soft, non-tender, non-distended, no organomegally Genitourinary: Not examined    Musculoskeletal: Shoulders have equal range of motion with no pain on palpation of right shoulder. 5/5 strength in right arm with negative Hawkins, Empty can, Speeds, [employer] [employer]. Pain with external rotation or right shoulder. Neuro: Alert, oriented Dermatologic: No obvious rashes Psychiatric: Full affect  Meds ordered this encounter  Medications  . meloxicam (MOBIC) 15 MG tablet    Sig: Take 1 tablet (15 mg total) by mouth daily.    Dispense:  10 tablet    Refill:  0    Assessment and Plan    Health Maintenance Due  Topic Date Due  . Hepatitis C Screening  1955/10/13  . INFLUENZA VACCINE  12/11/2014   Labs:  Orders Placed This Encounter  Procedures  . CBC with Differential    Standing Status: Future     Number of Occurrences:      Standing Expiration Date: 04/15/2016  . Lipid Panel    Standing Status: Future  Number of Occurrences:      Standing Expiration Date: 04/15/2016  . Comprehensive metabolic panel    Standing Status: Future     Number of Occurrences:      Standing Expiration Date: 04/15/2016  . Hepatitis C Antibody    Standing Status: Future     Number of Occurrences:      Standing Expiration Date: 04/15/2016  . HgB A1c    Standing Status: Future     Number of Occurrences:      Standing Expiration Date: 04/15/2016   Erectile dysfunction Stable. Will refill sildenafil  HYPERCHOLESTEROLEMIA Patient still trying natural remedies. Too early for lipid panel. He will come back for that. If LDL not improved, willing to try statin  again  Pain in joint of right shoulder Appears to be a rotator cuff injury. Patient wanting to try analgesics and therapy first. Given Naproxen trial. Will consider steroid injection if no improvement seen.

## 2015-04-19 DIAGNOSIS — M25511 Pain in right shoulder: Secondary | ICD-10-CM

## 2015-04-19 HISTORY — DX: Pain in right shoulder: M25.511

## 2015-04-19 NOTE — Assessment & Plan Note (Signed)
Stable.  Will refill sildenafil. 

## 2015-04-19 NOTE — Assessment & Plan Note (Signed)
Patient still trying natural remedies. Too early for lipid panel. He will come back for that. If LDL not improved, willing to try statin again

## 2015-04-19 NOTE — Assessment & Plan Note (Signed)
Appears to be a rotator cuff injury. Patient wanting to try analgesics and therapy first. Given Naproxen trial. Will consider steroid injection if no improvement seen.

## 2015-04-30 ENCOUNTER — Other Ambulatory Visit (INDEPENDENT_AMBULATORY_CARE_PROVIDER_SITE_OTHER): Payer: 59

## 2015-04-30 DIAGNOSIS — E7800 Pure hypercholesterolemia, unspecified: Secondary | ICD-10-CM

## 2015-04-30 DIAGNOSIS — Z0000 Encounter for general adult medical examination without abnormal findings: Secondary | ICD-10-CM

## 2015-04-30 DIAGNOSIS — Z1159 Encounter for screening for other viral diseases: Secondary | ICD-10-CM

## 2015-04-30 LAB — CBC WITH DIFFERENTIAL/PLATELET
BASOS PCT: 1 % (ref 0–1)
Basophils Absolute: 0 10*3/uL (ref 0.0–0.1)
EOS ABS: 0.1 10*3/uL (ref 0.0–0.7)
EOS PCT: 4 % (ref 0–5)
HCT: 43.5 % (ref 39.0–52.0)
HEMOGLOBIN: 15.4 g/dL (ref 13.0–17.0)
Lymphocytes Relative: 58 % — ABNORMAL HIGH (ref 12–46)
Lymphs Abs: 2.1 10*3/uL (ref 0.7–4.0)
MCH: 31.9 pg (ref 26.0–34.0)
MCHC: 35.4 g/dL (ref 30.0–36.0)
MCV: 90.1 fL (ref 78.0–100.0)
MONO ABS: 0.3 10*3/uL (ref 0.1–1.0)
MONOS PCT: 7 % (ref 3–12)
MPV: 9.2 fL (ref 8.6–12.4)
NEUTROS ABS: 1.1 10*3/uL — AB (ref 1.7–7.7)
Neutrophils Relative %: 30 % — ABNORMAL LOW (ref 43–77)
Platelets: 223 10*3/uL (ref 150–400)
RBC: 4.83 MIL/uL (ref 4.22–5.81)
RDW: 12.8 % (ref 11.5–15.5)
WBC: 3.7 10*3/uL — AB (ref 4.0–10.5)

## 2015-04-30 LAB — HEPATITIS C ANTIBODY: HCV Ab: NEGATIVE

## 2015-04-30 LAB — COMPREHENSIVE METABOLIC PANEL
ALBUMIN: 3.9 g/dL (ref 3.6–5.1)
ALK PHOS: 30 U/L — AB (ref 40–115)
ALT: 27 U/L (ref 9–46)
AST: 23 U/L (ref 10–35)
BILIRUBIN TOTAL: 0.8 mg/dL (ref 0.2–1.2)
BUN: 16 mg/dL (ref 7–25)
CALCIUM: 8.7 mg/dL (ref 8.6–10.3)
CO2: 27 mmol/L (ref 20–31)
Chloride: 102 mmol/L (ref 98–110)
Creat: 0.94 mg/dL (ref 0.70–1.33)
Glucose, Bld: 94 mg/dL (ref 65–99)
Potassium: 4.1 mmol/L (ref 3.5–5.3)
Sodium: 136 mmol/L (ref 135–146)
Total Protein: 6.9 g/dL (ref 6.1–8.1)

## 2015-04-30 LAB — LIPID PANEL
CHOL/HDL RATIO: 4.1 ratio (ref <=5.0)
Cholesterol: 191 mg/dL (ref 125–200)
HDL: 47 mg/dL (ref >=40)
LDL Cholesterol: 126 mg/dL (ref <130)
Triglycerides: 89 mg/dL (ref <150)
VLDL: 18 mg/dL (ref <30)

## 2015-04-30 LAB — POCT GLYCOSYLATED HEMOGLOBIN (HGB A1C): Hemoglobin A1C: 5.2

## 2015-04-30 NOTE — Progress Notes (Signed)
Labs done today Precious Gilchrest 

## 2015-05-12 ENCOUNTER — Other Ambulatory Visit: Payer: Self-pay | Admitting: Family Medicine

## 2015-05-23 ENCOUNTER — Encounter: Payer: Self-pay | Admitting: Family Medicine

## 2015-05-23 ENCOUNTER — Telehealth: Payer: Self-pay | Admitting: Family Medicine

## 2015-05-23 NOTE — Telephone Encounter (Signed)
I apologize. I thought I sent them. Will put them in queue for mailing to the patient today. Please inform.

## 2015-05-23 NOTE — Telephone Encounter (Signed)
LVM for pt to call back to inform him of below. Zimmerman Rumple, Greysin Medlen D, CMA  

## 2015-05-23 NOTE — Telephone Encounter (Signed)
Pt called and would like his lab results from December. jw

## 2015-05-23 NOTE — Telephone Encounter (Signed)
Will forward to Dr. Nettey. Tejah Brekke,CMA  

## 2015-05-29 NOTE — Telephone Encounter (Signed)
Spoke to pt wife. She asked if the results have been mailed. I told her they had and if they haven't received them by Friday to give [place] a call. Sunday Spillers, CMA

## 2016-03-11 DIAGNOSIS — Z713 Dietary counseling and surveillance: Secondary | ICD-10-CM | POA: Diagnosis not present

## 2016-04-30 ENCOUNTER — Encounter: Payer: Self-pay | Admitting: Internal Medicine

## 2016-04-30 ENCOUNTER — Encounter: Payer: 59 | Admitting: Internal Medicine

## 2016-04-30 ENCOUNTER — Ambulatory Visit (INDEPENDENT_AMBULATORY_CARE_PROVIDER_SITE_OTHER): Payer: BLUE CROSS/BLUE SHIELD | Admitting: Internal Medicine

## 2016-04-30 VITALS — BP 124/76 | HR 71 | Temp 98.1°F | Ht 69.0 in | Wt 186.0 lb

## 2016-04-30 DIAGNOSIS — E785 Hyperlipidemia, unspecified: Secondary | ICD-10-CM

## 2016-04-30 DIAGNOSIS — D72819 Decreased white blood cell count, unspecified: Secondary | ICD-10-CM

## 2016-04-30 DIAGNOSIS — R229 Localized swelling, mass and lump, unspecified: Secondary | ICD-10-CM

## 2016-04-30 DIAGNOSIS — E7800 Pure hypercholesterolemia, unspecified: Secondary | ICD-10-CM | POA: Diagnosis not present

## 2016-04-30 LAB — COMPLETE METABOLIC PANEL WITH GFR
ALT: 32 U/L (ref 9–46)
AST: 32 U/L (ref 10–35)
Albumin: 4.3 g/dL (ref 3.6–5.1)
Alkaline Phosphatase: 30 U/L — ABNORMAL LOW (ref 40–115)
BUN: 15 mg/dL (ref 7–25)
CHLORIDE: 104 mmol/L (ref 98–110)
CO2: 24 mmol/L (ref 20–31)
CREATININE: 1.04 mg/dL (ref 0.70–1.33)
Calcium: 9 mg/dL (ref 8.6–10.3)
GFR, Est Non African American: 78 mL/min (ref >=60)
Glucose, Bld: 118 mg/dL — ABNORMAL HIGH (ref 65–99)
Potassium: 4 mmol/L (ref 3.5–5.3)
SODIUM: 137 mmol/L (ref 135–146)
Total Bilirubin: 1.1 mg/dL (ref 0.2–1.2)
Total Protein: 6.7 g/dL (ref 6.1–8.1)

## 2016-04-30 LAB — CBC
HEMATOCRIT: 42.9 % (ref 38.5–50.0)
HEMOGLOBIN: 15.3 g/dL (ref 13.2–17.1)
MCH: 32.3 pg (ref 27.0–33.0)
MCHC: 35.7 g/dL (ref 32.0–36.0)
MCV: 90.5 fL (ref 80.0–100.0)
MPV: 9.4 fL (ref 7.5–12.5)
Platelets: 227 10*3/uL (ref 140–400)
RBC: 4.74 MIL/uL (ref 4.20–5.80)
RDW: 13.2 % (ref 11.0–15.0)
WBC: 3.5 10*3/uL — ABNORMAL LOW (ref 3.8–10.8)

## 2016-04-30 LAB — LIPID PANEL
Cholesterol: 199 mg/dL (ref <200)
HDL: 52 mg/dL (ref >40)
LDL CALC: 136 mg/dL — AB (ref <100)
TRIGLYCERIDES: 57 mg/dL (ref <150)
Total CHOL/HDL Ratio: 3.8 Ratio (ref <5.0)
VLDL: 11 mg/dL (ref <30)

## 2016-04-30 NOTE — Progress Notes (Signed)
   Subjective:    Horald PollenGeorge W Eskridge - 60 y.o. male MRN [id number]  Date of birth: 08/21/1955  HPI  Horald PollenGeorge W Bateson is here for annual physical exam.  Lump on left side: Present for approximately 2 months. Just happened to notice it. "Pea sized" and has not changed in size since he noticed it. Only painful when direct pressure applied over it. Lesion has remained under the skin and has never drained. No overlying erythema or skin changes. Can not recall a specific injury to that area. No fevers, night sweats, lack of appetite, or unintended weight loss.   ROS:  Patient reports no  vision/ hearing changes,anorexia, weight change, fever ,adenopathy, persistant / recurrent hoarseness, swallowing issues, chest pain, edema,persistant / recurrent cough, hemoptysis, dyspnea(rest, exertional, paroxysmal nocturnal), gastrointestinal  bleeding (melena, rectal bleeding), abdominal pain, excessive heart burn, GU symptoms(dysuria, hematuria, pyuria, voiding/incontinence  Issues) syncope, focal weakness, severe memory loss, concerning skin lesions, depression, anxiety, abnormal bruising/bleeding, major joint swelling.     There are no preventive care reminders to display for this patient.  -  reports that he has never smoked. He does not have any smokeless tobacco history on file. - Review of Systems: Per HPI. - Past Medical History: Patient Active Problem List   Diagnosis Date Noted  . Skin nodule 04/30/2016  . Pain in joint of right shoulder 04/19/2015  . Erectile dysfunction 04/26/2014  . HYPERCHOLESTEROLEMIA 07/09/2006  . MIGRAINE, UNSPEC., W/O INTRACTABLE MIGRAINE 07/09/2006  . Lumbago 07/09/2006   - Medications: reviewed and updated   Objective:   Physical Exam BP 124/76 (BP Location: Right Arm, Patient Position: Sitting, Cuff Size: Normal)   Pulse 71   Temp 98.1 F (36.7 C) (Oral)   Ht 5\' 9"  (1.753 m)   Wt 186 lb (84.4 kg)   SpO2 99%   BMI 27.47 kg/m  Gen: NAD, alert, cooperative with  exam, well-appearing HEENT: NCAT, PERRL, clear conjunctiva, oropharynx clear, supple neck CV: RRR, good S1/S2, no murmur, no edema, capillary refill brisk  Resp: CTABL, no wheezes, non-labored Abd: SNTND, BS present, no guarding or organomegaly Skin: small <1cm fibrotic, firm area underneath the skin on the lateral left trunk, area is mobile and not fixed, mildly tender with firm palpation, no erythema or increased warmth  Neuro: no gross deficits.  Psych: good insight, alert and oriented     Assessment & Plan:   Skin nodule Highest suspicion for a fibrotic nodule from previous injury to the muscle. Exam not consistent with a lipoma. Likely benign nodule. Given very small size, that it is not particularly bothersome to patient, and he has no concerning constitutional symptoms will continue to monitor. Return precautions of increase in size, painfulness, and skin color changes discussed.   HYPERCHOLESTEROLEMIA Patient has history of elevated total cholesterol and LDL. He has been on statin therapy in the past but discontinued due to headaches. Has been attempting lifestyle changes. Will check lipid panel today and discuss statin therapy if warranted.     Marcy Sirenatherine Wallace, D.O. 04/30/2016, 9:25 AM PGY-2, [facility]

## 2016-04-30 NOTE — Assessment & Plan Note (Signed)
Patient has history of elevated total cholesterol and LDL. He has been on statin therapy in the past but discontinued due to headaches. Has been attempting lifestyle changes. Will check lipid panel today and discuss statin therapy if warranted.

## 2016-04-30 NOTE — Patient Instructions (Signed)
The knot on your side feels like scar tissue in the muscle. If it gets bigger, becomes very painful, or you have associated skin changes over the area please let us know.   I will call you with your lab results.

## 2016-04-30 NOTE — Assessment & Plan Note (Signed)
Highest suspicion for a fibrotic nodule from previous injury to the muscle. Exam not consistent with a lipoma. Likely benign nodule. Given very small size, that it is not particularly bothersome to patient, and he has no concerning constitutional symptoms will continue to monitor. Return precautions of increase in size, painfulness, and skin color changes discussed.

## 2016-05-20 ENCOUNTER — Telehealth: Payer: Self-pay | Admitting: Internal Medicine

## 2016-05-20 NOTE — Telephone Encounter (Signed)
Do you need to speak with this patient about his lab results?  I will be glad to mail them if you are ok with it. Sunday SpillersSharon T Sevon Rotert, CMA

## 2016-05-20 NOTE — Telephone Encounter (Signed)
Pt would like lab results mailed to him. ep

## 2016-05-21 NOTE — Telephone Encounter (Signed)
You can mail him his results. Thanks!   Marcy Sirenatherine Kariah Loredo, D.O. 05/21/2016, 1:35 PM PGY-2, [facility]

## 2016-05-22 NOTE — Telephone Encounter (Signed)
Mailed out. Sunday SpillersSharon T Saunders, CMA

## 2017-04-08 ENCOUNTER — Other Ambulatory Visit: Payer: BLUE CROSS/BLUE SHIELD

## 2017-04-13 ENCOUNTER — Encounter: Payer: Self-pay | Admitting: Student

## 2017-04-13 ENCOUNTER — Other Ambulatory Visit: Payer: Self-pay

## 2017-04-13 ENCOUNTER — Ambulatory Visit (INDEPENDENT_AMBULATORY_CARE_PROVIDER_SITE_OTHER): Payer: BLUE CROSS/BLUE SHIELD | Admitting: Student

## 2017-04-13 VITALS — BP 118/78 | HR 69 | Temp 98.5°F | Ht 69.0 in | Wt 186.4 lb

## 2017-04-13 DIAGNOSIS — E7800 Pure hypercholesterolemia, unspecified: Secondary | ICD-10-CM | POA: Diagnosis not present

## 2017-04-13 DIAGNOSIS — Z1329 Encounter for screening for other suspected endocrine disorder: Secondary | ICD-10-CM

## 2017-04-13 DIAGNOSIS — Z0000 Encounter for general adult medical examination without abnormal findings: Secondary | ICD-10-CM

## 2017-04-13 DIAGNOSIS — M545 Low back pain, unspecified: Secondary | ICD-10-CM

## 2017-04-13 DIAGNOSIS — Z130 Encounter for screening for diseases of the blood and blood-forming organs and certain disorders involving the immune mechanism: Secondary | ICD-10-CM

## 2017-04-13 DIAGNOSIS — M79605 Pain in left leg: Secondary | ICD-10-CM | POA: Diagnosis not present

## 2017-04-13 DIAGNOSIS — Z23 Encounter for immunization: Secondary | ICD-10-CM | POA: Diagnosis not present

## 2017-04-13 DIAGNOSIS — R0789 Other chest pain: Secondary | ICD-10-CM | POA: Diagnosis not present

## 2017-04-13 MED ORDER — ZOSTER VAC RECOMB ADJUVANTED 50 MCG/0.5ML IM SUSR: 0.5000 mL | Freq: Once | INTRAMUSCULAR | 1 refills | Status: AC

## 2017-04-13 MED ORDER — MELOXICAM 15 MG PO TABS: 15.0000 mg | ORAL_TABLET | Freq: Every day | ORAL | 0 refills | Status: DC

## 2017-04-13 NOTE — Progress Notes (Signed)
Subjective:   Chief Complaint  Patient presents with  . Annual Exam   HPI George Aguilar is a 61 y.o. old male here  for annual exam.  Concern today:  Left sided chest pain: this has been there for one year. It wasn't painful last year. It started getting painful when touches it for the last two to three months. The lumps moves around. No GI symptoms. No injury. No history of surgery.   Left lower back pain: this has been going of for two to three weeks. No inciting factor. 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 He fell of the ladder on the edge of the concrete steps over 10 years. No serious injury at that time.  Radiates to his calf posteriorly. Pain worse when he wakes up in the morning. eases with moving.  No n/t,  Changes in his/her health in the last 12 months: no Occupation: [employer] Wears seatbelt: yes.    The patient has regular exercise: no. Enough vegetables and fruits: yes.  Smokes cigarette: no Drinks EtOH: beers. Occ > 2 beers over the weekend Drug use: no Patient takes ASA: no.  Patient takes vitD & Ca: m/vit Ever been transfused or tattooed?: no.  The patient is sexually active. Yes with wife Patient uses birth control: yes.  Domestic violence: no.  Advance directive: working on it now. History of depression:no.  Patient dental home: yes.  Immunizations  Needs influenza vaccine: had this.  Needs Shingrix (all >43yr of age): yes.  Needs Tdap: no.  Screening Need colon cancer screening: no. STOP BANG >/=3 for OSA: no. Need lung cancer screening:no. At risk for skin cancer: no. Need HCV Screening: no. Need STI Screening: no.  PMH/Problem List: has HYPERCHOLESTEROLEMIA; MIGRAINE, UNSPEC., W/O INTRACTABLE MIGRAINE; Lumbago; Erectile dysfunction; Pain in joint of right shoulder; and Skin nodule on their problem list.   has no past medical history on file.  [facility] Family [facility]  Problem Relation Age of Onset  . Diabetes Mother   . Ovarian  cancer Mother   . Diabetes Sister   . Diabetes Brother    Family history of heart disease before age of 629yrs: no. Family history of stroke: no. Family history of cancer: mother with ovarian cancer.  SH Social History   Tobacco Use  . Smoking status: Never Smoker  . Smokeless tobacco: Never Used  Substance Use Topics  . Alcohol use: Yes    Alcohol/week: 4.8 oz    Types: 8 Cans of beer per week  . Drug use: No     Review of Systems  Constitutional: Negative for diaphoresis, fatigue, fever and unexpected weight change.  HENT: Negative for congestion, hearing loss, trouble swallowing and voice change.   Eyes: Negative for visual disturbance.  Respiratory: Negative for cough, chest tightness and shortness of breath.   Cardiovascular: Negative for chest pain, palpitations and leg swelling.  Gastrointestinal: Negative for abdominal pain, blood in stool and diarrhea.  Endocrine: Negative for cold intolerance, heat intolerance, polydipsia, polyphagia and polyuria.  Genitourinary: Negative for dysuria, frequency, hematuria and scrotal swelling.  Musculoskeletal: Negative for arthralgias and myalgias.       Left sided chest wall pain and left side lower back pain  Skin: Negative for rash.  Neurological: Negative for dizziness, light-headedness and headaches.  Hematological: Negative for adenopathy. Does not bruise/bleed easily.  Psychiatric/Behavioral: Negative for dysphoric mood. The patient is not nervous/anxious.         Objective:   Physical Exam Vitals:  04/13/17 1006  BP: 118/78  Pulse: 69  Temp: 98.5 F (36.9 C)  TempSrc: Oral  SpO2: 99%  Weight: 186 lb 6.4 oz (84.6 kg)  Height: '5\' 9"'  (1.753 m)   Body mass index is 27.53 kg/m.  GEN: appears well, no apparent distress. Head: normocephalic and atraumatic  Eyes: conjunctiva without injection, sclera anicteric Ears: external ear and ear canal normal Nares: no rhinorrhea, congestion or erythema Oropharynx: mmm  without erythema or exudation HEM: negative for cervical or periauricular lymphadenopathies CVS: RRR, nl s1 & s2, no murmurs, no edema,  2+ DP & PT bil RESP: no IWOB, good air movement bilaterally, CTAB GI: BS present & normal, soft, NTND, no guarding, no rebound, no mass GU: no suprapubic or CVA tenderness MSK: tenderness to palpation over his 9th intercostal space on the left along the mid-axillary line. No palpable mass or overlying skin change.  Back & LE exam Normal skin, spine with normal alignment and no deformity. LE symmetric appears symmetric No step offs, no tenderness to palpation over spines or paraspinous muscles.   ROM is full at the lumbosacral region Lying and seated SLR are negative Neuro exam in LE: motor 5/5 in all muscle groups, light sensation intact in L4-S1 dermatomes, patellar reflexes  SKIN: no apparent skin lesion ENDO: negative thyromegally NEURO: alert and oiented appropriately, no gross deficits  PSYCH: euthymic mood with congruent affect    Assessment & Plan:  1. Annual physical exam: states that he had normal colonoscopy at [place] in 2012. I do not see the result in his chart. Sent reminder to p [facility] health maintenance to obtain this for [place]. Recommended regular exercise combining aerobic and resistance exercise about 150 minutes a week. Discussed about diet as well. Gave handout. Gave Rx for Shingrix  2. Musculoskeletal chest pain Gave Rx for meloxicam. Recommended using heating pad as well. If no improvement with this, will refer to sport medicine for ultrasound  3. Low back pain radiating to left lower extremity: no red flag for infectious process, cauda equina, fracture or malignancy. Neuro exam is completely reassuring - meloxicam (MOBIC) 15 MG tablet; Take 1 tablet (15 mg total) by mouth daily.  Dispense: 10 tablet; Refill: 0  4. HYPERCHOLESTEROLEMIA - CMP14+EGFR - Lipid panel  5. Screening for deficiency anemia - CBC with  Differential/Platelet  6. Screening for hypothyroidism - TSH  7. Encounter for immunization - Zoster Vaccine Adjuvanted [facility]) injection; Inject 0.5 mLs into the muscle once for 1 dose. Repeat dose in 2 to 6 months.  Dispense: 0.5 mL; Refill: 1   Follow up in one year.  Wendee Beavers PGY-3 Pager [phone] 04/13/17  8:17 PM

## 2017-04-13 NOTE — Patient Instructions (Signed)
It was great seeing you today! We have addressed the following issues today  Pain: I suggest trying the medication we gave you.  I also recommend trying heating pad.  If no improvement with these measures in the next 1 week, please let us know.   Staying healthy: Would recommend lifestyle change including diet and exercise.  Please see below for some tips on diet and exercise.  If we did any lab work today, and the results require attention, either me or my nurse will get in touch with you. If everything is normal, you will get a letter in mail and a message via . If you don't hear from us in two weeks, please give us a call. Otherwise, we look forward to seeing you again at your next visit. If you have any questions or concerns before then, please call the clinic at [phone].  Please bring all your medications to every doctors visit  Sign up for My Chart to have easy access to your labs results, and communication with your Primary care physician.    Please check-out at the front desk before leaving the clinic.    Take Care,   Dr. Alanda SlimGonfa  Portion Size   Choose healthier foods such as 100% whole grains, vegetables, fruits, beans, nut seeds, olive oil, most vegetable oils, fat-free dietary, wild game and fish.   Avoid sweet tea, other sweetened beverages, soda, fruit juice, cold cereal and milk and trans fat.   Eat at least 3 meals and 1-2 snacks per day.  Aim for no more than 5 hours between eating.  Eat breakfast within one hour of getting up.    Exercise at least 150 minutes per week, including weight resistance exercises 3 or 4 times per week.   Try to lose at least 7-10% of your current body weight.   Limit your salt (Sodium) intake to less than 2 gm (2000 mg) a day if you have conditions such as  elevated blood pressure, heart failure...    You may also read about DASH and/or Mediterranean diet at the following web site if you have blood pressure or heart  condition. [employer]id[employer].com.cywww.mayoclinic.org/healthy-lifestyle/nutrition-and-healthy-eating/in-depth/dash-diet http://m[place]inney.org/www.mayoclinic.org/healthy-lifestyle/nutrition-and-healthy-eating/in-depth/mediterranean-diet/

## 2017-04-14 LAB — CBC WITH DIFFERENTIAL/PLATELET
Basophils Absolute: 0 10*3/uL (ref 0.0–0.2)
Basos: 0 %
EOS (ABSOLUTE): 0.1 10*3/uL (ref 0.0–0.4)
EOS: 2 %
HEMOGLOBIN: 16.5 g/dL (ref 13.0–17.7)
Hematocrit: 46.1 % (ref 37.5–51.0)
IMMATURE GRANULOCYTES: 0 %
Immature Grans (Abs): 0 10*3/uL (ref 0.0–0.1)
Lymphocytes Absolute: 1.6 10*3/uL (ref 0.7–3.1)
Lymphs: 52 %
MCH: 32.1 pg (ref 26.6–33.0)
MCHC: 35.8 g/dL — ABNORMAL HIGH (ref 31.5–35.7)
MCV: 90 fL (ref 79–97)
MONOCYTES: 6 %
Monocytes Absolute: 0.2 10*3/uL (ref 0.1–0.9)
Neutrophils Absolute: 1.3 10*3/uL — ABNORMAL LOW (ref 1.4–7.0)
Neutrophils: 40 %
Platelets: 215 10*3/uL (ref 150–379)
RBC: 5.14 x10E6/uL (ref 4.14–5.80)
RDW: 13.4 % (ref 12.3–15.4)
WBC: 3.1 10*3/uL — ABNORMAL LOW (ref 3.4–10.8)

## 2017-04-14 LAB — CMP14+EGFR
ALBUMIN: 4.7 g/dL (ref 3.5–5.5)
ALK PHOS: 35 IU/L — AB (ref 39–117)
ALT: 33 IU/L (ref 0–44)
AST: 27 IU/L (ref 0–40)
Albumin/Globulin Ratio: 1.8 (ref 1.2–2.2)
BUN/Creatinine Ratio: 12 (ref 9–20)
BUN: 12 mg/dL (ref 6–24)
Bilirubin Total: 0.8 mg/dL (ref 0.0–1.2)
CO2: 24 mmol/L (ref 20–29)
CREATININE: 0.97 mg/dL (ref 0.76–1.27)
Calcium: 9.5 mg/dL (ref 8.7–10.2)
Chloride: 100 mmol/L (ref 96–106)
GFR calc Af Amer: 98 mL/min/{1.73_m2} (ref >59)
GFR calc non Af Amer: 85 mL/min/{1.73_m2} (ref >59)
GLUCOSE: 114 mg/dL — AB (ref 65–99)
Globulin, Total: 2.6 g/dL (ref 1.5–4.5)
Potassium: 4.4 mmol/L (ref 3.5–5.2)
Sodium: 140 mmol/L (ref 134–144)
TOTAL PROTEIN: 7.3 g/dL (ref 6.0–8.5)

## 2017-04-14 LAB — LIPID PANEL
CHOL/HDL RATIO: 4.4 ratio (ref 0.0–5.0)
Cholesterol, Total: 234 mg/dL — ABNORMAL HIGH (ref 100–199)
HDL: 53 mg/dL (ref >39)
LDL CALC: 164 mg/dL — AB (ref 0–99)
TRIGLYCERIDES: 87 mg/dL (ref 0–149)
VLDL CHOLESTEROL CAL: 17 mg/dL (ref 5–40)

## 2017-04-14 LAB — TSH: TSH: 1.45 u[IU]/mL (ref 0.450–4.500)

## 2017-04-15 ENCOUNTER — Encounter: Payer: Self-pay | Admitting: Student

## 2017-04-15 NOTE — Progress Notes (Signed)
Result letter sent to patient

## 2017-10-26 DIAGNOSIS — M9903 Segmental and somatic dysfunction of lumbar region: Secondary | ICD-10-CM | POA: Diagnosis not present

## 2017-10-26 DIAGNOSIS — M9902 Segmental and somatic dysfunction of thoracic region: Secondary | ICD-10-CM | POA: Diagnosis not present

## 2017-10-26 DIAGNOSIS — M9904 Segmental and somatic dysfunction of sacral region: Secondary | ICD-10-CM | POA: Diagnosis not present

## 2017-10-26 DIAGNOSIS — M5416 Radiculopathy, lumbar region: Secondary | ICD-10-CM | POA: Diagnosis not present

## 2017-10-28 DIAGNOSIS — M9904 Segmental and somatic dysfunction of sacral region: Secondary | ICD-10-CM | POA: Diagnosis not present

## 2017-10-28 DIAGNOSIS — M9902 Segmental and somatic dysfunction of thoracic region: Secondary | ICD-10-CM | POA: Diagnosis not present

## 2017-10-28 DIAGNOSIS — M5416 Radiculopathy, lumbar region: Secondary | ICD-10-CM | POA: Diagnosis not present

## 2017-10-28 DIAGNOSIS — M9903 Segmental and somatic dysfunction of lumbar region: Secondary | ICD-10-CM | POA: Diagnosis not present

## 2017-11-02 DIAGNOSIS — M9902 Segmental and somatic dysfunction of thoracic region: Secondary | ICD-10-CM | POA: Diagnosis not present

## 2017-11-02 DIAGNOSIS — M9903 Segmental and somatic dysfunction of lumbar region: Secondary | ICD-10-CM | POA: Diagnosis not present

## 2017-11-02 DIAGNOSIS — M5416 Radiculopathy, lumbar region: Secondary | ICD-10-CM | POA: Diagnosis not present

## 2017-11-02 DIAGNOSIS — M9904 Segmental and somatic dysfunction of sacral region: Secondary | ICD-10-CM | POA: Diagnosis not present

## 2017-11-04 DIAGNOSIS — M9903 Segmental and somatic dysfunction of lumbar region: Secondary | ICD-10-CM | POA: Diagnosis not present

## 2017-11-04 DIAGNOSIS — M9904 Segmental and somatic dysfunction of sacral region: Secondary | ICD-10-CM | POA: Diagnosis not present

## 2017-11-04 DIAGNOSIS — M9902 Segmental and somatic dysfunction of thoracic region: Secondary | ICD-10-CM | POA: Diagnosis not present

## 2017-11-04 DIAGNOSIS — M5416 Radiculopathy, lumbar region: Secondary | ICD-10-CM | POA: Diagnosis not present

## 2018-04-15 ENCOUNTER — Ambulatory Visit (INDEPENDENT_AMBULATORY_CARE_PROVIDER_SITE_OTHER): Payer: BLUE CROSS/BLUE SHIELD | Admitting: Family Medicine

## 2018-04-15 VITALS — BP 122/75 | HR 77 | Temp 98.7°F | Wt 178.6 lb

## 2018-04-15 DIAGNOSIS — Z0000 Encounter for general adult medical examination without abnormal findings: Secondary | ICD-10-CM

## 2018-04-15 DIAGNOSIS — N529 Male erectile dysfunction, unspecified: Secondary | ICD-10-CM

## 2018-04-15 LAB — POCT GLYCOSYLATED HEMOGLOBIN (HGB A1C): Hemoglobin A1C: 5.3 % (ref 4.0–5.6)

## 2018-04-15 MED ORDER — TADALAFIL 5 MG PO TABS: 2.5000 mg | ORAL_TABLET | ORAL | 1 refills | Status: DC | PRN

## 2018-04-15 NOTE — Assessment & Plan Note (Signed)
Patient is very healthy.  He eats well, gets plenty of exercise on his bicycle, and has a positive outlook.  He does not take any medications other than cialis currently.  CBC, CMP, lipid panel, and A1c were drawn today.

## 2018-04-15 NOTE — Progress Notes (Signed)
Subjective:   Chief Complaint  Patient presents with  . Annual Exam   HPI George Aguilar is a 62 y.o. old male here  for annual exam.  Concern today:lump on left side of torso.  Sometimes it is tender to palpation.  It has been there for about two years and is about the same size.      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   The patient has regular exercise: yes. 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   Enough vegetables and fruits: limits red meat.  Eats chicken. Veggies and smoothies. Fruits. .  Smokes cigarette: no Drinks EtOH: yes. Weekend drinker. Maybe a six pack  Drug use: no Patient takes ASA: no.  Patient takes vitD & Ca: no. Ever been transfused or tattooed?: not asked.  The patient is sexually active. Used viagra one time. Has had issues with maintaining.  erections. Has erections during the night.   Patient uses birth control: not applicable.  Domestic violence: no.  History of depression:no. . Hospice job he enjoys,    Immunizations  Needs influenza vaccine: no. Has it from work.    Needs HPV (Women until age 61): no.  Needs Shingrix (all >4957yrs of age): no.  Needs Tdap: no.  Needs Pneumococcal: no. 1. 10719 to 61 years of age  -Intermediate risk groups (smokers; chronic heart, lung and liver  disease, DM & alcoholism) PPSV23 alone:  (Grade 1B).   -High risk groups (asplenia, immunocompromised [HIV, CA], CSF leak, cochlear implant, advanced kidney dis)-PCV13, then PPSV23 after 8 wks. (Grade 1B). PCV13 after 8514yr If already had PPSV23.  2.   Age ? 65: PCV13 followed by PPSV23 6 to 12 months later. PCV13 after 3614yr If already had PPSV23.  Screening Need colon cancer screening: no. Need breast cancer ccreening: not applicable. Need cervical cancer Screening: not applicable. STOP BANG >/=3 for OSA: no. Need lung cancer screening  (men > 55):no. Need AAA screening (men 65-74, >100 cigarettes):no At risk for skin cancer: no. Need HCV Screening: yes. Need STI Screening: no. Fall in the last 12 months:no  PMH/Problem List: has HYPERCHOLESTEROLEMIA; MIGRAINE, UNSPEC., W/O INTRACTABLE MIGRAINE; Lumbago; Annual physical exam; Erectile dysfunction; Pain in joint of right shoulder; and Skin nodule on their problem list.   has no past medical history on file.  [facility]  Family History  Problem Relation Age of Onset  . Diabetes Mother   . Ovarian cancer Mother   . Diabetes Sister   . Diabetes Brother    Family history of heart disease before age of 61 yrs: no. Family history of stroke: no. Family history of cancer: no.  SH Social History   Tobacco Use  . Smoking status: Never Smoker  . Smokeless tobacco: Never Used  Substance Use Topics  . Alcohol use: Yes    Alcohol/week: 8.0 standard drinks    Types: 8 Cans of beer per week  . Drug use: No     Review of Systems      Objective:   Physical Exam Vitals:   04/15/18 1341  BP: 122/75  Pulse: 77  Temp: 98.7 F (37.1 C)  TempSrc: Oral  SpO2: 99%  Weight: 178 lb 9.6 oz (81 kg)   Body mass index is 26.37 kg/m.  GEN: appears well & comfortable. No apparent distress. Head: normocephalic and atraumatic  Eyes: conjunctiva without injection. Sclera anicteric. PERRLA.  EOMI.  Nares: no rhinorrhea. no swollen turbinates. noerythema of nasal mucosa Oropharynx: MMM. No erythema. No exudation or petechiae.  Uvula midline HEM: negative for cervical or periauricular lymphadenopathies CVS: RRR, nl s1 & s2, no murmurs, no edema,  2+ radial pulses bilaterally,   RESP: no IWOB, good air movement bilaterally, CTAB GI: BS present & normal, soft, NTND, no guarding, no rebound, no palpable mass MSK: no focal tenderness or notable swelling SKIN: pea sized subcutaneous nodule on left ribs.   ENDO: negative thyromegally  NEURO: alert and oiented appropriately, no gross  deficits   PSYCH: euthymic mood with congruent affect     Assessment & Plan:   Patient is very healthy.  He eats well, gets plenty of exercise on his bicycle, and has a positive outlook.  He does not take any medications other than cialis currently.  CBC, CMP, lipid panel, and A1c were drawn today.    Erectile dysfunction Patient only tried his viagra a few times when he was prescribed it two years ago.  He is still having issues with maintaining erections.  Will prescribe 2.5mg  cialis every other day.     George Aguilar PGY-1 [facility] 04/15/18  5:08 PM

## 2018-04-15 NOTE — Patient Instructions (Signed)
It was a pleasure to meet you today George Aguilar,   George Aguilar are doing a great job of staying healthy.  Continue to eat healthy and exercise.  I will let you know the results of the blood tests we ordered.  If your cholesterol level is high then I will call you to discuss statin medicines to lower it.    I have prescribed you Cialis.  You can take half of a pill every other day to start and see how that works for you.  If you think it is not working well enough you can increase it to one pill every other day  And see how that works.    Tadalafil tablets (Cialis) What is this medicine? TADALAFIL (tah DA la fil) is used to treat erection problems in men. It is also used for enlargement of the prostate gland in men, a condition called benign prostatic hyperplasia or BPH. This medicine improves urine flow and reduces BPH symptoms. This medicine can also treat both erection problems and BPH when they occur together. This medicine may be used for other purposes; ask your health care provider or pharmacist if you have questions. COMMON BRAND NAME(S): Cialis What should I tell my health care provider before I take this medicine? They need to know if you have any of these conditions: -bleeding disorders -eye or vision problems, including a rare inherited eye disease called retinitis pigmentosa -anatomical deformation of the penis, Peyronie's disease, or history of priapism (painful and prolonged erection) -heart disease, angina, a history of heart attack, irregular heart beats, or other heart problems -high or low blood pressure -history of blood diseases, like sickle cell anemia or leukemia -history of stomach bleeding -kidney disease -liver disease -stroke -an unusual or allergic reaction to tadalafil, other medicines, foods, dyes, or preservatives -pregnant or trying to get pregnant -breast-feeding How should I use this medicine? Take this medicine by mouth with a glass of water. Follow the directions  on the prescription label. You may take this medicine with or without meals. When this medicine is used for erection problems, your doctor may prescribe it to be taken once daily or as needed. If you are taking the medicine as needed, you may be able to have sexual activity 30 minutes after taking it and for up to 36 hours after taking it. Whether you are taking the medicine as needed or once daily, you should not take more than one dose per day. If you are taking this medicine for symptoms of benign prostatic hyperplasia (BPH) or to treat both BPH and an erection problem, take the dose once daily at about the same time each day. Do not take your medicine more often than directed. Talk to your pediatrician regarding the use of this medicine in children. Special care may be needed. Overdosage: If you think you have taken too much of this medicine contact a poison control center or emergency room at once. NOTE: This medicine is only for you. Do not share this medicine with others. What if I miss a dose? If you are taking this medicine as needed for erection problems, this does not apply. If you miss a dose while taking this medicine once daily for an erection problem, benign prostatic hyperplasia, or both, take it as soon as you remember, but do not take more than one dose per day. What may interact with this medicine? Do not take this medicine with any of the following medications: -nitrates like amyl nitrite, isosorbide dinitrate, isosorbide  mononitrate, nitroglycerin -other medicines for erectile dysfunction like avanafil, sildenafil, vardenafil -other tadalafil products (Adcirca) -riociguat This medicine may also interact with the following medications: -certain drugs for high blood pressure -certain drugs for the treatment of HIV infection or AIDS -certain drugs used for fungal or yeast infections, like fluconazole, itraconazole, ketoconazole, and voriconazole -certain drugs used for seizures like  carbamazepine, phenytoin, and phenobarbital -grapefruit juice -macrolide antibiotics like clarithromycin, erythromycin, troleandomycin -medicines for prostate problems -rifabutin, rifampin or rifapentine This list may not describe all possible interactions. Give your health care provider a list of all the medicines, herbs, non-prescription drugs, or dietary supplements you use. Also tell them if you smoke, drink alcohol, or use illegal drugs. Some items may interact with your medicine. What should I watch for while using this medicine? If you notice any changes in your vision while taking this drug, call your doctor or health care professional as soon as possible. Stop using this medicine and call your health care provider right away if you have a loss of sight in one or both eyes. Contact your doctor or health care professional right away if the erection lasts longer than 4 hours or if it becomes painful. This may be a sign of serious problem and must be treated right away to prevent permanent damage. If you experience symptoms of nausea, dizziness, chest pain or arm pain upon initiation of sexual activity after taking this medicine, you should refrain from further activity and call your doctor or health care professional as soon as possible. Do not drink alcohol to excess (examples, 5 glasses of wine or 5 shots of whiskey) when taking this medicine. When taken in excess, alcohol can increase your chances of getting a headache or getting dizzy, increasing your heart rate or lowering your blood pressure. Using this medicine does not protect you or your partner against HIV infection (the virus that causes AIDS) or other sexually transmitted diseases. What side effects may I notice from receiving this medicine? Side effects that you should report to your doctor or health care professional as soon as possible: -allergic reactions like skin rash, itching or hives, swelling of the face, lips, or  tongue -breathing problems -changes in hearing -changes in vision -chest pain -fast, irregular heartbeat -prolonged or painful erection -seizures Side effects that usually do not require medical attention (report to your doctor or health care professional if they continue or are bothersome): -back pain -dizziness -flushing -headache -indigestion -muscle aches -nausea -stuffy or runny nose This list may not describe all possible side effects. Call your doctor for medical advice about side effects. You may report side effects to FDA at 1-800-FDA-1088. Where should I keep my medicine? Keep out of the reach of children. Store at room temperature between 15 and 30 degrees C (59 and 86 degrees F). Throw away any unused medicine after the expiration date. NOTE: This sheet is a summary. It may not cover all possible information. If you have questions about this medicine, talk to your doctor, pharmacist, or health care provider.  2018 Elsevier/Gold Standard (2013-09-16 13:15:49)

## 2018-04-15 NOTE — Assessment & Plan Note (Signed)
Patient only tried his viagra a few times when he was prescribed it two years ago.  He is still having issues with maintaining erections.  Will prescribe 2.5mg  cialis every other day.

## 2018-04-16 LAB — LIPID PANEL
CHOLESTEROL TOTAL: 226 mg/dL — AB (ref 100–199)
Chol/HDL Ratio: 3.9 ratio (ref 0.0–5.0)
HDL: 58 mg/dL (ref >39)
LDL CALC: 136 mg/dL — AB (ref 0–99)
Triglycerides: 159 mg/dL — ABNORMAL HIGH (ref 0–149)
VLDL Cholesterol Cal: 32 mg/dL (ref 5–40)

## 2018-04-16 LAB — CBC WITH DIFFERENTIAL/PLATELET
Basophils Absolute: 0 10*3/uL (ref 0.0–0.2)
Basos: 1 %
EOS (ABSOLUTE): 0.1 10*3/uL (ref 0.0–0.4)
Eos: 3 %
HEMOGLOBIN: 15.2 g/dL (ref 13.0–17.7)
Hematocrit: 44.1 % (ref 37.5–51.0)
Immature Grans (Abs): 0 10*3/uL (ref 0.0–0.1)
Immature Granulocytes: 0 %
LYMPHS ABS: 1.6 10*3/uL (ref 0.7–3.1)
Lymphs: 48 %
MCH: 30.8 pg (ref 26.6–33.0)
MCHC: 34.5 g/dL (ref 31.5–35.7)
MCV: 90 fL (ref 79–97)
MONOCYTES: 8 %
Monocytes Absolute: 0.3 10*3/uL (ref 0.1–0.9)
Neutrophils Absolute: 1.3 10*3/uL — ABNORMAL LOW (ref 1.4–7.0)
Neutrophils: 40 %
PLATELETS: 239 10*3/uL (ref 150–450)
RBC: 4.93 x10E6/uL (ref 4.14–5.80)
RDW: 12.6 % (ref 12.3–15.4)
WBC: 3.2 10*3/uL — ABNORMAL LOW (ref 3.4–10.8)

## 2018-04-16 LAB — COMPREHENSIVE METABOLIC PANEL
ALK PHOS: 39 IU/L (ref 39–117)
ALT: 27 IU/L (ref 0–44)
AST: 23 IU/L (ref 0–40)
Albumin/Globulin Ratio: 1.7 (ref 1.2–2.2)
Albumin: 4.6 g/dL (ref 3.6–4.8)
BILIRUBIN TOTAL: 0.6 mg/dL (ref 0.0–1.2)
BUN/Creatinine Ratio: 9 — ABNORMAL LOW (ref 10–24)
BUN: 9 mg/dL (ref 8–27)
CHLORIDE: 98 mmol/L (ref 96–106)
CO2: 26 mmol/L (ref 20–29)
CREATININE: 0.97 mg/dL (ref 0.76–1.27)
Calcium: 9.7 mg/dL (ref 8.6–10.2)
GFR calc Af Amer: 98 mL/min/{1.73_m2} (ref >59)
GFR calc non Af Amer: 84 mL/min/{1.73_m2} (ref >59)
Globulin, Total: 2.7 g/dL (ref 1.5–4.5)
Glucose: 88 mg/dL (ref 65–99)
Potassium: 4.3 mmol/L (ref 3.5–5.2)
Sodium: 140 mmol/L (ref 134–144)
Total Protein: 7.3 g/dL (ref 6.0–8.5)

## 2018-04-19 ENCOUNTER — Encounter: Payer: Self-pay | Admitting: Family Medicine

## 2018-07-09 ENCOUNTER — Encounter: Payer: Self-pay | Admitting: Family Medicine

## 2018-07-09 ENCOUNTER — Ambulatory Visit (INDEPENDENT_AMBULATORY_CARE_PROVIDER_SITE_OTHER): Payer: BLUE CROSS/BLUE SHIELD | Admitting: Family Medicine

## 2018-07-09 ENCOUNTER — Other Ambulatory Visit: Payer: Self-pay

## 2018-07-09 VITALS — BP 118/72 | HR 71 | Temp 98.5°F | Ht 69.0 in | Wt 183.4 lb

## 2018-07-09 DIAGNOSIS — H8110 Benign paroxysmal vertigo, unspecified ear: Secondary | ICD-10-CM | POA: Diagnosis not present

## 2018-07-09 DIAGNOSIS — R42 Dizziness and giddiness: Secondary | ICD-10-CM

## 2018-07-09 DIAGNOSIS — R7309 Other abnormal glucose: Secondary | ICD-10-CM

## 2018-07-09 MED ORDER — MECLIZINE HCL 25 MG PO TABS: 25.0000 mg | ORAL_TABLET | Freq: Three times a day (TID) | ORAL | 0 refills | Status: DC | PRN

## 2018-07-09 NOTE — Patient Instructions (Signed)
It was nice to meet you today!  I think this will get better with time Sent in medication for you to try Checking labwork Come back if not better in 2 weeks or if getting worse  Be well, Dr. Pollie Meyer    Benign Positional Vertigo Vertigo is the feeling that you or your surroundings are moving when they are not. Benign positional vertigo is the most common form of vertigo. This is usually a harmless condition (benign). This condition is positional. This means that symptoms are triggered by certain movements and positions. This condition can be dangerous if it occurs while you are doing something that could cause harm to you or others. This includes activities such as driving or operating machinery. What are the causes? In many cases, the cause of this condition is not known. It may be caused by a disturbance in an area of the inner ear that helps your brain to sense movement and balance. This disturbance can be caused by:  Viral infection (labyrinthitis).  Head injury.  Repetitive motion, such as jumping, dancing, or running. What increases the risk? You are more likely to develop this condition if:  You are a woman.  You are 39 years of age or older. What are the signs or symptoms? Symptoms of this condition usually happen when you move your head or your eyes in different directions. Symptoms may start suddenly, and usually last for less than a minute. They include:  Loss of balance and falling.  Feeling like you are spinning or moving.  Feeling like your surroundings are spinning or moving.  Nausea and vomiting.  Blurred vision.  Dizziness.  Involuntary eye movement (nystagmus). Symptoms can be mild and cause only minor problems, or they can be severe and interfere with daily life. Episodes of benign positional vertigo may return (recur) over time. Symptoms may improve over time. How is this diagnosed? This condition may be diagnosed based on:  Your medical  history.  Physical exam of the head, neck, and ears.  Tests, such as: ? MRI. ? CT scan. ? Eye movement tests. Your health care provider may ask you to change positions quickly while he or she watches you for symptoms of benign positional vertigo, such as nystagmus. Eye movement may be tested with a variety of exams that are designed to evaluate or stimulate vertigo. ? An electroencephalogram (EEG). This records electrical activity in your brain. ? Hearing tests. You may be referred to a health care provider who specializes in ear, nose, and throat (ENT) problems (otolaryngologist) or a provider who specializes in disorders of the nervous system (neurologist). How is this treated?  This condition may be treated in a session in which your health care provider moves your head in specific positions to adjust your inner ear back to normal. Treatment for this condition may take several sessions. Surgery may be needed in severe cases, but this is rare. In some cases, benign positional vertigo may resolve on its own in 2-4 weeks. Follow these instructions at home: Safety  Move slowly. Avoid sudden body or head movements or certain positions, as told by your health care provider.  Avoid driving until your health care provider says it is safe for you to do so.  Avoid operating heavy machinery until your health care provider says it is safe for you to do so.  Avoid doing any tasks that would be dangerous to you or others if vertigo occurs.  If you have trouble walking or keeping your balance,  try using a cane for stability. If you feel dizzy or unstable, sit down right away.  Return to your normal activities as told by your health care provider. Ask your health care provider what activities are safe for you. General instructions  Take over-the-counter and prescription medicines only as told by your health care provider.  Drink enough fluid to keep your urine pale yellow.  Keep all follow-up  visits as told by your health care provider. This is important. Contact a health care provider if:  You have a fever.  Your condition gets worse or you develop new symptoms.  Your family or friends notice any behavioral changes.  You have nausea or vomiting that gets worse.  You have numbness or a "pins and needles" sensation. Get help right away if you:  Have difficulty speaking or moving.  Are always dizzy.  Faint.  Develop severe headaches.  Have weakness in your legs or arms.  Have changes in your hearing or vision.  Develop a stiff neck.  Develop sensitivity to light. Summary  Vertigo is the feeling that you or your surroundings are moving when they are not. Benign positional vertigo is the most common form of vertigo.  The cause of this condition is not known. It may be caused by a disturbance in an area of the inner ear that helps your brain to sense movement and balance.  Symptoms include loss of balance and falling, feeling that you or your surroundings are moving, nausea and vomiting, and blurred vision.  This condition can be diagnosed based on symptoms, physical exam, and other tests, such as MRI, CT scan, eye movement tests, and hearing tests.  Follow safety instructions as told by your health care provider. You will also be told when to contact your health care provider in case of problems. This information is not intended to replace advice given to you by your health care provider. Make sure you discuss any questions you have with your health care provider. Document Released: 02/03/2006 Document Revised: 10/07/2017 Document Reviewed: 10/07/2017 Elsevier Interactive Patient Education  2019 [employer].

## 2018-07-09 NOTE — Progress Notes (Signed)
Date of Visit: 07/09/2018   HPI:  Patient presents to discuss vertigo.  Three weeks ago he had the flu. Since that time has felt a little off balance and like his head is spinning when he changes positions. No fevers. Flu symptoms are better. No ear pain. Symptoms are intermittent. He is able to do his normal activities but symptoms worsen whenever he goes from sitting to standing. He wants to ensure he doesn't have diabetes - has strong family history. Also has felt somewhat fatigued since the flu.  ROS: See HPI.  PMFSH: history of hyperlipidemia, ED, migraine  PHYSICAL EXAM: BP 118/72   Pulse 71   Temp 98.5 F (36.9 C) (Oral)   Ht 5\' 9"  (1.753 m)   Wt 183 lb 6.4 oz (83.2 kg)   SpO2 98%   BMI 27.08 kg/m  Gen: no acute distress, pleasant, cooperative, well appearing HEENT: normocephalic, atraumatic, moist mucous membranes, tympanic membranes bilaterally obscured by cerumen impaction. Nares patent.  Heart: regular rate and rhythm  Lungs: clear to auscultation bilaterally cranial nerves II-XII tested and intact. Speech normal. Full strength bilat upper and lower ext. Normal FNF. Sensation intact over bilateral upper & lower extremities. Neg romberg. Neg Dix-Hallpike (no nystagmus) but patient did have vertiginous symptoms going from lying to sitting up & turning head.   ASSESSMENT/PLAN:  Benign paroxysmal positional vertigo Symptoms consistent with BPPV after viral infection. No features raising concern for central vertigo. Discussed nature of condition with patient. Will do trial of meclizine. Given return precautions. Also check labs (CBC, CMET, A1c) by patient preference.  FOLLOW UP: Follow up as needed if symptoms worsen or do not improve.   [place] J. Pollie Meyer, MD [facility]

## 2018-07-09 NOTE — Assessment & Plan Note (Signed)
Symptoms consistent with BPPV after viral infection. No features raising concern for central vertigo. Discussed nature of condition with patient. Will do trial of meclizine. Given return precautions. Also check labs (CBC, CMET, A1c) by patient preference.

## 2018-07-10 LAB — CBC
HEMOGLOBIN: 15.2 g/dL (ref 13.0–17.7)
Hematocrit: 43.6 % (ref 37.5–51.0)
MCH: 31.9 pg (ref 26.6–33.0)
MCHC: 34.9 g/dL (ref 31.5–35.7)
MCV: 91 fL (ref 79–97)
Platelets: 275 10*3/uL (ref 150–450)
RBC: 4.77 x10E6/uL (ref 4.14–5.80)
RDW: 12.4 % (ref 11.6–15.4)
WBC: 3.6 10*3/uL (ref 3.4–10.8)

## 2018-07-10 LAB — CMP14+EGFR
ALT: 27 IU/L (ref 0–44)
AST: 22 IU/L (ref 0–40)
Albumin/Globulin Ratio: 1.6 (ref 1.2–2.2)
Albumin: 4.4 g/dL (ref 3.8–4.8)
Alkaline Phosphatase: 39 IU/L (ref 39–117)
BUN/Creatinine Ratio: 10 (ref 10–24)
BUN: 11 mg/dL (ref 8–27)
Bilirubin Total: 0.6 mg/dL (ref 0.0–1.2)
CO2: 22 mmol/L (ref 20–29)
Calcium: 9.6 mg/dL (ref 8.6–10.2)
Chloride: 102 mmol/L (ref 96–106)
Creatinine, Ser: 1.1 mg/dL (ref 0.76–1.27)
GFR calc Af Amer: 83 mL/min/{1.73_m2} (ref >59)
GFR, EST NON AFRICAN AMERICAN: 72 mL/min/{1.73_m2} (ref >59)
GLOBULIN, TOTAL: 2.8 g/dL (ref 1.5–4.5)
Glucose: 108 mg/dL — ABNORMAL HIGH (ref 65–99)
Potassium: 4.2 mmol/L (ref 3.5–5.2)
Sodium: 141 mmol/L (ref 134–144)
Total Protein: 7.2 g/dL (ref 6.0–8.5)

## 2018-07-12 ENCOUNTER — Encounter: Payer: Self-pay | Admitting: Family Medicine

## 2018-07-12 LAB — HEMOGLOBIN A1C
Est. average glucose Bld gHb Est-mCnc: 114 mg/dL
Hgb A1c MFr Bld: 5.6 % (ref 4.8–5.6)

## 2018-07-12 LAB — SPECIMEN STATUS REPORT

## 2019-02-06 DIAGNOSIS — H524 Presbyopia: Secondary | ICD-10-CM | POA: Diagnosis not present

## 2019-04-06 ENCOUNTER — Encounter (HOSPITAL_COMMUNITY): Payer: Self-pay | Admitting: Emergency Medicine

## 2019-04-06 ENCOUNTER — Other Ambulatory Visit: Payer: Self-pay

## 2019-04-06 ENCOUNTER — Emergency Department (HOSPITAL_COMMUNITY)
Admission: EM | Admit: 2019-04-06 | Discharge: 2019-04-06 | Disposition: A | Discharge status: Home / Self Care | Payer: BC Managed Care – PPO | Attending: Emergency Medicine | Admitting: Emergency Medicine

## 2019-04-06 ENCOUNTER — Emergency Department (HOSPITAL_COMMUNITY): Payer: BC Managed Care – PPO

## 2019-04-06 DIAGNOSIS — Y929 Unspecified place or not applicable: Secondary | ICD-10-CM | POA: Insufficient documentation

## 2019-04-06 DIAGNOSIS — Y999 Unspecified external cause status: Secondary | ICD-10-CM | POA: Insufficient documentation

## 2019-04-06 DIAGNOSIS — S93492A Sprain of other ligament of left ankle, initial encounter: Secondary | ICD-10-CM | POA: Diagnosis not present

## 2019-04-06 DIAGNOSIS — X58XXXA Exposure to other specified factors, initial encounter: Secondary | ICD-10-CM | POA: Insufficient documentation

## 2019-04-06 DIAGNOSIS — S99912A Unspecified injury of left ankle, initial encounter: Secondary | ICD-10-CM | POA: Diagnosis present

## 2019-04-06 DIAGNOSIS — Y939 Activity, unspecified: Secondary | ICD-10-CM | POA: Diagnosis not present

## 2019-04-06 DIAGNOSIS — M25572 Pain in left ankle and joints of left foot: Secondary | ICD-10-CM | POA: Diagnosis not present

## 2019-04-06 MED ORDER — HYDROCODONE-ACETAMINOPHEN 5-325 MG PO TABS
1.0000 | ORAL_TABLET | Freq: Once | ORAL | Status: AC
  Administered 2019-04-06: 1 via ORAL
  Filled 2019-04-06: qty 1

## 2019-04-06 MED ORDER — HYDROCODONE-ACETAMINOPHEN 5-325 MG PO TABS: 1.0000 | ORAL_TABLET | Freq: Four times a day (QID) | ORAL | 0 refills | Status: DC | PRN

## 2019-04-06 MED ORDER — PREDNISONE 20 MG PO TABS
60.0000 mg | ORAL_TABLET | Freq: Once | ORAL | Status: AC
  Administered 2019-04-06: 60 mg via ORAL
  Filled 2019-04-06: qty 3

## 2019-04-06 MED ORDER — PREDNISONE 50 MG PO TABS: ORAL_TABLET | ORAL | 0 refills | Status: DC

## 2019-04-06 MED ORDER — IBUPROFEN 400 MG PO TABS
400.0000 mg | ORAL_TABLET | Freq: Once | ORAL | Status: AC
  Administered 2019-04-06: 400 mg via ORAL
  Filled 2019-04-06: qty 1

## 2019-04-06 NOTE — ED Provider Notes (Signed)
[facility] EMERGENCY DEPARTMENT Provider Note   CSN: [id number] Arrival date & time: 04/06/19  0243     History   Chief Complaint Chief Complaint  Patient presents with   Ankle Pain    HPI George Aguilar is a 63 y.o. male.     The history is provided by the patient.  Ankle Pain Location:  Ankle Time since incident:  1 week Ankle location:  L ankle Pain details:    Quality:  Aching   Radiates to:  Does not radiate   Severity:  Severe   Onset quality:  Gradual   Timing:  Constant   Progression:  Worsening Chronicity:  New Relieved by:  Nothing Worsened by:  Bearing weight Associated symptoms: no back pain and no neck pain    Patient reports he sprained and twisted his left ankle over a week ago.  He was not evaluated for the injury and continued to work.  Tonight the pain in his left ankle became more severe and it hurts to walk.  No new trauma tonight.  He reports swelling around the ankle.  No other acute traumatic injuries reported   Patient Active Problem List   Diagnosis Date Noted   Benign paroxysmal positional vertigo 07/09/2018   Skin nodule 04/30/2016   Pain in joint of right shoulder 04/19/2015   Erectile dysfunction 04/26/2014   Annual physical exam 10/31/2011   HYPERCHOLESTEROLEMIA 07/09/2006   MIGRAINE, UNSPEC., W/O INTRACTABLE MIGRAINE 07/09/2006   Lumbago 07/09/2006    No past surgical history on file.      Home Medications    Prior to Admission medications   Medication Sig Start Date End Date Taking? Authorizing Provider  meclizine (ANTIVERT) 25 MG tablet Take 1 tablet (25 mg total) by mouth 3 (three) times daily as needed for dizziness. 07/09/18   Leeanne Rio, MD  tadalafil (CIALIS) 5 MG tablet Take 0.5 tablets (2.5 mg total) by mouth every other day as needed for erectile dysfunction. 04/15/18 08/13/18  Benay Pike, MD    Family History Family History  Problem Relation Age of Onset   Diabetes  Mother    Ovarian cancer Mother    Diabetes Sister    Diabetes Brother     Social History Social History   Tobacco Use   Smoking status: Never Smoker   Smokeless tobacco: Never Used  Substance Use Topics   Alcohol use: Yes    Alcohol/week: 8.0 standard drinks    Types: 8 Cans of beer per week   Drug use: No     Allergies   Patient has no known allergies.   Review of Systems Review of Systems  Musculoskeletal: Positive for arthralgias. Negative for back pain and neck pain.  All other systems reviewed and are negative.    Physical Exam Updated Vital Signs BP 121/65 (BP Location: Right Arm)    Pulse 87    Temp 98.4 F (36.9 C) (Oral)    Resp 18    SpO2 100%   Physical Exam CONSTITUTIONAL: Well developed/well nourished HEAD: Normocephalic/atraumatic EYES: EOMI  ENMT: Mask in place NECK: supple no meningeal signs SPINE/BACK:entire spine nontender CV: S1/S2 noted, no murmurs/rubs/gallops noted LUNGS: Lungs are clear to auscultation bilaterally, no apparent distress ABDOMEN: soft, nontender  NEURO: Pt is awake/alert/appropriate, moves all extremitiesx4.  No facial droop.   EXTREMITIES: pulses normal/equal, full ROM, tenderness and swelling noted to the left lateral/medial malleolus.  Distal pulses intact to left foot.  There is no  left foot tenderness.  Able to move all toes on the left foot.  Left Achilles intact.  There is no left calf edema or tenderness SKIN: warm, color normal PSYCH: no abnormalities of mood noted, alert and oriented to situation   ED Treatments / Results  Labs (all labs ordered are listed, but only abnormal results are displayed) Labs Reviewed - No data to display  EKG None  Radiology Dg Ankle Complete Left  Result Date: 04/06/2019 CLINICAL DATA:  Ankle pain EXAM: LEFT ANKLE COMPLETE - 3+ VIEW COMPARISON:  None. FINDINGS: There is no evidence of fracture, dislocation, or joint effusion. There is no evidence of arthropathy or other  focal bone abnormality. Soft tissues are unremarkable. IMPRESSION: Negative. Electronically Signed   By: Deatra Robinson M.D.   On: 04/06/2019 03:32    Procedures Procedures    Medications Ordered in ED Medications  predniSONE (DELTASONE) tablet 60 mg (has no administration in time range)  ibuprofen (ADVIL) tablet 400 mg (400 mg Oral Given 04/06/19 0325)  HYDROcodone-acetaminophen (NORCO/VICODIN) 5-325 MG per tablet 1 tablet (1 tablet Oral Given 04/06/19 0325)     Initial Impression / Assessment and Plan / ED Course  I have reviewed the triage vital signs and the nursing notes.  Pertinent  imaging results that were available during my care of the patient were reviewed by me and considered in my medical decision making (see chart for details).        Patient reports that he slipped in the bathtub injuring his left ankle over a week ago.  He reports he had very minimal pain at that time.  He reports it slowly has worsened since that time. On exam he does have ankle swelling and mild warmth.  No deformities.  Distal pulses intact. No signs of DVT.  No signs of any left knee or proximal fibula injury  Patient appears to have pain out of portion to exam.  Due to the joint swelling and warmth, this could represent gout as it sounds like his initial injury was very  innocuous  Will place on a short course of steroids as well as pain medicines.  Will place in crutches and refer to orthopedic Final Clinical Impressions(s) / ED Diagnoses   Final diagnoses:  Sprain of other ligament of left ankle, initial encounter    ED Discharge Orders         Ordered    predniSONE (DELTASONE) 50 MG tablet     04/06/19 0352    HYDROcodone-acetaminophen (NORCO/VICODIN) 5-325 MG tablet  Every 6 hours PRN     04/06/19 0352           Zadie Rhine, MD 04/06/19 0354

## 2019-04-06 NOTE — Progress Notes (Signed)
Orthopedic Tech Progress Note Patient Details:  George Aguilar [birth year] [id number]  Ortho Devices Type of Ortho Device: ASO, Crutches Ortho Device/Splint Location: lle Ortho Device/Splint Interventions: Ordered, Application, Adjustment   Post Interventions Patient Tolerated: Well Instructions Provided: Care of device, Adjustment of device   George Aguilar 04/06/2019, 5:17 AM

## 2019-04-06 NOTE — ED Notes (Signed)
Ortho contacted for crutches and aso

## 2019-04-06 NOTE — ED Notes (Signed)
Pt returned from XR. Visitor at bedside.

## 2019-04-06 NOTE — ED Notes (Signed)
Patient transported to X-ray 

## 2019-04-06 NOTE — ED Notes (Signed)
Patient verbalized understanding of dc instructions, vss, ambulatory with nad.   

## 2019-04-06 NOTE — ED Triage Notes (Signed)
Pt c/o left ankle pain and swelling x 1 week after twisting extremity.

## 2019-04-14 ENCOUNTER — Encounter: Payer: Self-pay | Admitting: Orthopaedic Surgery

## 2019-04-14 ENCOUNTER — Other Ambulatory Visit: Payer: Self-pay

## 2019-04-14 ENCOUNTER — Ambulatory Visit (INDEPENDENT_AMBULATORY_CARE_PROVIDER_SITE_OTHER): Payer: BC Managed Care – PPO | Admitting: Orthopaedic Surgery

## 2019-04-14 ENCOUNTER — Telehealth: Payer: Self-pay

## 2019-04-14 DIAGNOSIS — M009 Pyogenic arthritis, unspecified: Secondary | ICD-10-CM | POA: Insufficient documentation

## 2019-04-14 DIAGNOSIS — M25572 Pain in left ankle and joints of left foot: Secondary | ICD-10-CM

## 2019-04-14 DIAGNOSIS — S93402A Sprain of unspecified ligament of left ankle, initial encounter: Secondary | ICD-10-CM | POA: Insufficient documentation

## 2019-04-14 MED ORDER — TRAMADOL HCL 50 MG PO TABS: 50.0000 mg | ORAL_TABLET | Freq: Every day | ORAL | 2 refills | Status: DC | PRN

## 2019-04-14 MED ORDER — IBUPROFEN 800 MG PO TABS: 800.0000 mg | ORAL_TABLET | Freq: Three times a day (TID) | ORAL | 2 refills | Status: DC | PRN

## 2019-04-14 NOTE — Telephone Encounter (Signed)
[facility] called stating that a diagnosis is needed for the Rx for Tramadol 50mg .  Cb# is [phone].  Please advise.  Thank you.

## 2019-04-14 NOTE — Progress Notes (Signed)
Office Visit Note   Patient: George Aguilar           Date of Birth: 1955/07/21           MRN: [id number] Visit Date: 04/14/2019              Requested by: Sandre Kitty, MD [address] [place],  [place] [address] PCP: Sandre Kitty, MD   Assessment & Plan: Visit Diagnoses:  1. Pain in left ankle and joints of left foot     Plan: My impression is grade 3 left ankle sprain.  I do not feel that the ASO brace provides enough support therefore we will place him in a cam boot and he may weight-bear as tolerated with crutches if needed.  Prescription for Advil and Ultram for breakthrough pain.  He will continue to rest and ice and elevate.  We will write him out of work for 2 weeks and recheck after that time.  Follow-Up Instructions: Return in about 2 weeks (around 04/28/2019).   Orders:  Orders Placed This Encounter  Procedures  . Uric acid   Meds ordered this encounter  Medications  . ibuprofen (ADVIL) 800 MG tablet    Sig: Take 1 tablet (800 mg total) by mouth every 8 (eight) hours as needed.    Dispense:  30 tablet    Refill:  2  . traMADol (ULTRAM) 50 MG tablet    Sig: Take 1-2 tablets (50-100 mg total) by mouth daily as needed.    Dispense:  20 tablet    Refill:  2      Procedures: No procedures performed   Clinical Data: No additional findings.   Subjective: Chief Complaint  Patient presents with  . Left Ankle - Pain    George Aguilar is a very pleasant 63 year old gentleman who is accompanied today for evaluation of a left ankle sprain.  This is an ED follow-up from 04/06/2019.  He states that he has been wearing the ASO brace as prescribed and ambulating with crutches.  The pain is worse at night.  He does have swelling as well.  He has been elevating his leg and using ibuprofen.  Denies any numbness and tingling.   Review of Systems  Constitutional: Negative.   All other systems reviewed and are negative.    Objective: Vital Signs: There were  no vitals taken for this visit.  Physical Exam Vitals signs and nursing note reviewed.  Constitutional:      Appearance: He is well-developed.  HENT:     Head: Normocephalic and atraumatic.  Eyes:     Pupils: Pupils are equal, round, and reactive to light.  Neck:     Musculoskeletal: Neck supple.  Pulmonary:     Effort: Pulmonary effort is normal.  Abdominal:     Palpations: Abdomen is soft.  Musculoskeletal: Normal range of motion.  Skin:    General: Skin is warm.  Neurological:     Mental Status: He is alert and oriented to person, place, and time.  Psychiatric:        Behavior: Behavior normal.        Thought Content: Thought content normal.        Judgment: Judgment normal.     Ortho Exam Left ankle exam shows moderate swelling and joint effusion.  He is tender over the deltoid ligament as well as the ATFL.  His Achilles is intact.  Range of motion is limited secondary to pain and guarding.  Specialty  Comments:  No specialty comments available.  Imaging: No results found.   PMFS History: Patient Active Problem List   Diagnosis Date Noted  . Pain in left ankle and joints of left foot 04/14/2019  . Benign paroxysmal positional vertigo 07/09/2018  . Skin nodule 04/30/2016  . Pain in joint of right shoulder 04/19/2015  . Erectile dysfunction 04/26/2014  . Annual physical exam 10/31/2011  . HYPERCHOLESTEROLEMIA 07/09/2006  . MIGRAINE, UNSPEC., W/O INTRACTABLE MIGRAINE 07/09/2006  . Lumbago 07/09/2006   History reviewed. No pertinent past medical history.  Family History  Problem Relation Age of Onset  . Diabetes Mother   . Ovarian cancer Mother   . Diabetes Sister   . Diabetes Brother     History reviewed. No pertinent surgical history. Social History   Occupational History  . Occupation: maintenance   Tobacco Use  . Smoking status: Never Smoker  . Smokeless tobacco: Never Used  Substance and Sexual Activity  . Alcohol use: Yes    Alcohol/week: 8.0  standard drinks    Types: 8 Cans of beer per week  . Drug use: No  . Sexual activity: Yes    Birth control/protection: Condom    Comment: Women

## 2019-04-15 NOTE — Telephone Encounter (Signed)
Called [facility] and gave Dx code

## 2019-04-19 ENCOUNTER — Other Ambulatory Visit: Payer: Self-pay

## 2019-04-19 ENCOUNTER — Ambulatory Visit (INDEPENDENT_AMBULATORY_CARE_PROVIDER_SITE_OTHER): Payer: BC Managed Care – PPO | Admitting: Family Medicine

## 2019-04-19 ENCOUNTER — Encounter: Payer: Self-pay | Admitting: Family Medicine

## 2019-04-19 VITALS — BP 123/80 | HR 93 | Wt 175.4 lb

## 2019-04-19 DIAGNOSIS — N529 Male erectile dysfunction, unspecified: Secondary | ICD-10-CM | POA: Diagnosis not present

## 2019-04-19 DIAGNOSIS — S93402D Sprain of unspecified ligament of left ankle, subsequent encounter: Secondary | ICD-10-CM | POA: Diagnosis not present

## 2019-04-19 DIAGNOSIS — Z0000 Encounter for general adult medical examination without abnormal findings: Secondary | ICD-10-CM

## 2019-04-19 DIAGNOSIS — E7800 Pure hypercholesterolemia, unspecified: Secondary | ICD-10-CM

## 2019-04-19 MED ORDER — ZOSTER VAC RECOMB ADJUVANTED 50 MCG/0.5ML IM SUSR: 0.5000 mL | Freq: Once | INTRAMUSCULAR | 0 refills | Status: AC

## 2019-04-19 MED ORDER — TADALAFIL 5 MG PO TABS: 2.5000 mg | ORAL_TABLET | ORAL | 4 refills | Status: DC | PRN

## 2019-04-19 NOTE — Progress Notes (Signed)
[name] Phone: [phone]     George Aguilar - 63 y.o. male MRN [id number]  Date of birth: 1955-08-24  Subjective:   cc: Annual exam  HPI:  Patient states he is doing well.  Had a catheter than the pain in his ankle from injuring it a few weeks ago.  Patient's relation with his wife is good.  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  Has not been riding his bike as much recently, but has been focusing on his diet and has lost some weight.  Sprained left ankle: Shortly before Thanksgiving the patient slipped while in the shower and twisted his ankle.  He is able to continue working on it and it did not bother him that much for the next week, but then started to become more painful and he went to the emergency department.  They took x-rays which did not show any fractures.  He then went to an orthopedist afterwards who confirmed this.  Since going to orthopedics he has been in a walking boot and limiting his weight applied to the leg.  And elevated when resting.  For pain he takes ibuprofen 800 mg 3 times a day and tramadol as needed up to twice a day.  Erectile dysfunction.:  Patient states that his medication works well, he just wishes he had more of the pills as he has had to ration them since last visit a year ago.  ROS: See HPI for pertinent positives and negatives  Family history reviewed for today's visit. No changes.  Social history- patient is a non-smoker   Objective:   BP 123/80   Pulse 93   Wt 175 lb 6.4 oz (79.6 kg)   SpO2 97%   BMI 25.90 kg/m  Gen: Alert and oriented.  Athletic build. HEENT: Moist oral mucosa, PERRLA.  No scleral icterus. Neck: No thyromegaly. CV: Regular rate and rhythm, no murmurs. Resp: Lungs clear to auscultation bilaterally.  No wheezes, no crackles. GI: Soft, nontender to palpation.  Normal bowel sounds. Msk: Golf ball size soft tissue swelling below the medial malleolus on the left leg, with some  more minor swelling below the lateral malleolus.  Dorsalis pedis and posterior tibialis pulses present bilaterally.  No loss of sensation in the toes.  No discoloration.  Tenderness to palpation on the medial side.   Assessment/Plan:   Annual physical exam Overall doing well.  He is very the patient for his age.  No social concerns.  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  Gave patient prescription for shingles vaccination which he can get at pharmacy.  Patient received his flu shot at work.  Erectile dysfunction Tolerating the medication well.  No side effects.  Gave patient enough refills to last until his next visit with me in a year.  Left ankle sprain Patient states that he continues to have pain in that ankle since he injured it between 2 and 3 weeks ago.  The reason he is likely still having a significant amount of pain is that he continues to walk on it and exert himself for a week after he sprained it.  He does state that the swelling has decreased.  Has a follow-up appointment with the orthopedist on the 17th. -Advised patient to schedule his ibuprofen every 8 hours and to use the tramadol for breakthrough pain with the goal of taking off the tramadol first and then reducing the ibuprofen from scheduled  to as needed and then quit completely. -Advised patient to keep his foot elevated when resting.     Frederic Jericho, MD PGY-2 [facility]

## 2019-04-19 NOTE — Patient Instructions (Addendum)
It was nice to see you again,  For your ankle, I believe it is just sprained.  These can be painful and can take several weeks to fully recover.  For now I would like you to take your ibuprofen every 8 hours scheduled and take the tramadol twice a day.  When your leg starts to feel better start taking off the tramadol first and then the ibuprofen so that you eventually get down to not needing any of the medications.  If you start to lose feeling in that foot or it becomes cold to the touch, that would be reason to come back to see [place] or in urgent care if you are unable to see [place] that day.  I will follow-up with the results of your cholesterol and diabetes test today.  I have refilled your Cialis and given you 3 more refills.  This way you should not have to ration them to make them last longer.  I will give you a written prescription for the shingles vaccine.  After checking, it appears you do not need the pneumonia vaccine until you are 63 years old.  We can hold off on that for now.  Have a great day,  Clemetine Marker, MD

## 2019-04-20 LAB — LIPID PANEL
Chol/HDL Ratio: 4 ratio (ref 0.0–5.0)
Cholesterol, Total: 186 mg/dL (ref 100–199)
HDL: 46 mg/dL (ref >39)
LDL Chol Calc (NIH): 129 mg/dL — ABNORMAL HIGH (ref 0–99)
Triglycerides: 60 mg/dL (ref 0–149)
VLDL Cholesterol Cal: 11 mg/dL (ref 5–40)

## 2019-04-20 LAB — HEMOGLOBIN A1C
Est. average glucose Bld gHb Est-mCnc: 111 mg/dL
Hgb A1c MFr Bld: 5.5 % (ref 4.8–5.6)

## 2019-04-21 NOTE — Assessment & Plan Note (Signed)
Patient states that he continues to have pain in that ankle since he injured it between 2 and 3 weeks ago.  The reason he is likely still having a significant amount of pain is that he continues to walk on it and exert himself for a week after he sprained it.  He does state that the swelling has decreased.  Has a follow-up appointment with the orthopedist on the 17th. -Advised patient to schedule his ibuprofen every 8 hours and to use the tramadol for breakthrough pain with the goal of taking off the tramadol first and then reducing the ibuprofen from scheduled to as needed and then quit completely. -Advised patient to keep his foot elevated when resting.

## 2019-04-21 NOTE — Assessment & Plan Note (Signed)
Tolerating the medication well.  No side effects.  Gave patient enough refills to last until his next visit with me in a year.

## 2019-04-21 NOTE — Assessment & Plan Note (Signed)
Overall doing well.  He is very the patient for his age.  No social concerns.  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  Gave patient prescription for shingles vaccination which he can get at pharmacy.  Patient received his flu shot at work.

## 2019-04-25 ENCOUNTER — Telehealth: Payer: Self-pay | Admitting: Orthopaedic Surgery

## 2019-04-25 NOTE — Telephone Encounter (Signed)
I will have to call [facility].

## 2019-04-25 NOTE — Telephone Encounter (Signed)
Would like to know about his blood work results.

## 2019-04-25 NOTE — Telephone Encounter (Signed)
It says it was collected but no result in epic.  Can you check on this please.  Thanks.

## 2019-04-25 NOTE — Telephone Encounter (Signed)
Patient called and requesting a call back. Patient states he received a voicemail. Patient phone number is [phone]

## 2019-04-28 ENCOUNTER — Telehealth: Payer: Self-pay

## 2019-04-28 ENCOUNTER — Ambulatory Visit (HOSPITAL_COMMUNITY)
Admission: RE | Admit: 2019-04-28 | Discharge: 2019-04-28 | Disposition: A | Discharge status: Home / Self Care | Payer: BC Managed Care – PPO | Source: Ambulatory Visit | Attending: Orthopaedic Surgery | Admitting: Orthopaedic Surgery

## 2019-04-28 ENCOUNTER — Other Ambulatory Visit: Payer: Self-pay

## 2019-04-28 ENCOUNTER — Ambulatory Visit (INDEPENDENT_AMBULATORY_CARE_PROVIDER_SITE_OTHER): Payer: BC Managed Care – PPO | Admitting: Orthopaedic Surgery

## 2019-04-28 ENCOUNTER — Encounter: Payer: Self-pay | Admitting: Orthopaedic Surgery

## 2019-04-28 DIAGNOSIS — M25572 Pain in left ankle and joints of left foot: Secondary | ICD-10-CM

## 2019-04-28 NOTE — Addendum Note (Signed)
Addended by: Precious Bard on: 04/28/2019 11:07 AM   Modules accepted: Orders

## 2019-04-28 NOTE — Telephone Encounter (Signed)
Pending fax from [facility] with Uric Acid results.

## 2019-04-28 NOTE — Telephone Encounter (Signed)
Thanks

## 2019-04-28 NOTE — Progress Notes (Signed)
Office Visit Note   Patient: George Aguilar           Date of Birth: 10/22/1955           MRN: [id number] Visit Date: 04/28/2019              Requested by: Sandre Kitty, MD [address] [place],  [place] [address] PCP: Sandre Kitty, MD   Assessment & Plan: Visit Diagnoses:  1. Pain in left ankle and joints of left foot     Plan: Impression is left ankle pain and swelling with unusual presentation.  We will obtain a Doppler to rule out DVT I will as well as a stat MRI to rule out infection.  Follow-Up Instructions: Return if symptoms worsen or fail to improve.   Orders:  No orders of the defined types were placed in this encounter.  No orders of the defined types were placed in this encounter.     Procedures: No procedures performed   Clinical Data: No additional findings.   Subjective: Chief Complaint  Patient presents with  . Left Ankle - Pain, Follow-up    George Aguilar returns today for continued left ankle pain and swelling.  He states that he has some chills at night.  He is out of work currently.  He continues to have significant swelling in his left ankle.  Uric acid labs from last time is still pending in the system for some reason.   Review of Systems  Constitutional: Negative.   All other systems reviewed and are negative.    Objective: Vital Signs: There were no vitals taken for this visit.  Physical Exam Vitals and nursing note reviewed.  Constitutional:      Appearance: He is well-developed.  Pulmonary:     Effort: Pulmonary effort is normal.  Abdominal:     Palpations: Abdomen is soft.  Skin:    General: Skin is warm.  Neurological:     Mental Status: He is alert and oriented to person, place, and time.  Psychiatric:        Behavior: Behavior normal.        Thought Content: Thought content normal.        Judgment: Judgment normal.     Ortho Exam Left foot ankle exam shows moderate swelling.  No neurovascular compromise.   He has some referred pain up into the calf.  No cellulitis.  There is no warmth of the foot ankle.  3+ pitting edema.  Calf is mildly tender to palpation. Specialty Comments:  No specialty comments available.  Imaging: No results found.   PMFS History: Patient Active Problem List   Diagnosis Date Noted  . Left ankle sprain 04/14/2019  . Benign paroxysmal positional vertigo 07/09/2018  . Skin nodule 04/30/2016  . Pain in joint of right shoulder 04/19/2015  . Erectile dysfunction 04/26/2014  . Annual physical exam 10/31/2011  . HYPERCHOLESTEROLEMIA 07/09/2006  . MIGRAINE, UNSPEC., W/O INTRACTABLE MIGRAINE 07/09/2006  . Lumbago 07/09/2006   History reviewed. No pertinent past medical history.  Family History  Problem Relation Age of Onset  . Diabetes Mother   . Ovarian cancer Mother   . Diabetes Sister   . Diabetes Brother     History reviewed. No pertinent surgical history. Social History   Occupational History  . Occupation: maintenance   Tobacco Use  . Smoking status: Never Smoker  . Smokeless tobacco: Never Used  Substance and Sexual Activity  . Alcohol use: Yes  Alcohol/week: 8.0 standard drinks    Types: 8 Cans of beer per week  . Drug use: No  . Sexual activity: Yes    Birth control/protection: Condom    Comment: Women

## 2019-04-28 NOTE — Telephone Encounter (Signed)
Matt with [facility] called stating that patient is Negative for DVT Left LE.  Please advise.  Thank you.

## 2019-04-28 NOTE — Addendum Note (Signed)
Addended by: Precious Bard on: 04/28/2019 11:00 AM   Modules accepted: Orders

## 2019-04-28 NOTE — Telephone Encounter (Signed)
I called [facility] right before lunch regarding this. Do we have any faxes yet from [place]?

## 2019-04-29 ENCOUNTER — Telehealth: Payer: Self-pay | Admitting: Orthopaedic Surgery

## 2019-04-29 NOTE — Telephone Encounter (Signed)
Patient is requesting a call back from Dr. Erlinda Hong nurse about test results. Patient phone number is [phone].

## 2019-04-29 NOTE — Telephone Encounter (Signed)
Placed fax form on your desk.

## 2019-05-02 NOTE — Telephone Encounter (Signed)
Called patient to let him know Labs were normal per Dr. Erlinda Hong. He is aware also about U/S (- DVT.)

## 2019-05-09 ENCOUNTER — Encounter: Payer: Self-pay | Admitting: Family Medicine

## 2019-05-12 ENCOUNTER — Telehealth: Payer: Self-pay | Admitting: Orthopaedic Surgery

## 2019-05-12 NOTE — Telephone Encounter (Signed)
Jennifer with [facility] called. Says patient's FMLA forms has a date of 05/02/2019 return to work. Says patient is not able to work at this time. Would like to speak with someone about correcting form. Her call back number is [phone]

## 2019-05-16 NOTE — Telephone Encounter (Signed)
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

## 2019-05-17 ENCOUNTER — Telehealth: Payer: Self-pay | Admitting: Orthopaedic Surgery

## 2019-05-17 NOTE — Telephone Encounter (Signed)
What do we need to put in note to be excused for jury duty

## 2019-05-17 NOTE — Telephone Encounter (Signed)
Patient called advised he has been summoned for jury duty 06/01/2019 and will not be able to go because his foot is really swollen. Patient asked if he can get a note excusing him from jury duty? The number to contact patient is [phone]

## 2019-05-17 NOTE — Telephone Encounter (Signed)
Updated oow note and revised fmla forms to reflect pts approx rtw date 05/27/2019 to pts employer, [employer], [employer] [phone], phone [phone]

## 2019-05-17 NOTE — Telephone Encounter (Signed)
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 He has appt 05/26/2018. OOW until next appt.

## 2019-05-17 NOTE — Telephone Encounter (Signed)
I would just state his injuries and that he should not attend jury duty at this time.

## 2019-05-17 NOTE — Telephone Encounter (Signed)
See xu response: Essentially, grade 3 ankle sprain with unusual presentation.  Awaiting MRI. Will need to be out of jury duty at this point

## 2019-05-19 ENCOUNTER — Telehealth: Payer: Self-pay | Admitting: Orthopaedic Surgery

## 2019-05-19 NOTE — Telephone Encounter (Signed)
Note made and ready for pick up. Patient aware. 

## 2019-05-19 NOTE — Telephone Encounter (Signed)
Patient called requesting a doctor's note to be excused from [place] duty on Jan. 20, [address]201. Patient states he is in no condition to make jury duty. Patient states note is  needed asap for note need to be mailed back 10 days prior to date. Patient requesting a call back from Dr. Roda Shutters assistant Adalberto Cole. Patient phone number is [phone].

## 2019-05-19 NOTE — Telephone Encounter (Signed)
Note made and ready for pick up. Patient aware.

## 2019-05-20 ENCOUNTER — Other Ambulatory Visit: Payer: Self-pay

## 2019-05-20 ENCOUNTER — Ambulatory Visit
Admission: RE | Admit: 2019-05-20 | Discharge: 2019-05-20 | Disposition: A | Discharge status: Home / Self Care | Payer: BC Managed Care – PPO | Source: Ambulatory Visit | Attending: Orthopaedic Surgery | Admitting: Orthopaedic Surgery

## 2019-05-20 DIAGNOSIS — M25472 Effusion, left ankle: Secondary | ICD-10-CM | POA: Diagnosis not present

## 2019-05-20 DIAGNOSIS — M25572 Pain in left ankle and joints of left foot: Secondary | ICD-10-CM

## 2019-05-20 MED ORDER — GADOBENATE DIMEGLUMINE 529 MG/ML IV SOLN
15.0000 mL | Freq: Once | INTRAVENOUS | Status: AC | PRN
  Administered 2019-05-20: 15 mL via INTRAVENOUS

## 2019-05-20 NOTE — Progress Notes (Signed)
Needs 8:15 am appt Tuesday thanks.

## 2019-05-23 ENCOUNTER — Telehealth: Payer: Self-pay

## 2019-05-23 NOTE — Telephone Encounter (Signed)
Called to make sure he was aware of this.

## 2019-05-23 NOTE — Telephone Encounter (Signed)
Called patient to let him know we changed his appt to tomorrow at 8:15 per Dr. Roda Shutters.

## 2019-05-24 ENCOUNTER — Other Ambulatory Visit: Payer: Self-pay

## 2019-05-24 ENCOUNTER — Ambulatory Visit (INDEPENDENT_AMBULATORY_CARE_PROVIDER_SITE_OTHER): Payer: BC Managed Care – PPO | Admitting: Orthopaedic Surgery

## 2019-05-24 ENCOUNTER — Encounter: Payer: Self-pay | Admitting: Orthopaedic Surgery

## 2019-05-24 VITALS — BP 124/72 | HR 88 | Temp 97.7°F

## 2019-05-24 DIAGNOSIS — M25572 Pain in left ankle and joints of left foot: Secondary | ICD-10-CM | POA: Diagnosis not present

## 2019-05-24 DIAGNOSIS — M25472 Effusion, left ankle: Secondary | ICD-10-CM | POA: Diagnosis not present

## 2019-05-24 DIAGNOSIS — M65172 Other infective (teno)synovitis, left ankle and foot: Secondary | ICD-10-CM | POA: Diagnosis not present

## 2019-05-24 MED ORDER — BUPIVACAINE HCL 0.5 % IJ SOLN
1.0000 mL | INTRAMUSCULAR | Status: AC | PRN
  Administered 2019-05-24: 09:00:00 1 mL via INTRA_ARTICULAR

## 2019-05-24 MED ORDER — LIDOCAINE HCL 1 % IJ SOLN
1.0000 mL | INTRAMUSCULAR | Status: AC | PRN
  Administered 2019-05-24: 09:00:00 1 mL

## 2019-05-24 NOTE — Progress Notes (Signed)
Office Visit Note   Patient: George Aguilar           Date of Birth: 10/10/1955           MRN: [id number] Visit Date: 05/24/2019              Requested by: George Kitty, MD [address] [place],  [place] [address] PCP: George Kitty, MD   Assessment & Plan: Visit Diagnoses:  1. Pain in left ankle and joints of left foot     Plan: MRI of the left ankle reviewed with the patient in detail which shows a large joint effusion with synovitis concerning for inflammatory arthropathy versus infection.  Ankle aspiration performed today.  I was only able to obtain about 7 cc of fluid from the ankle aspiration.  I attempted to get more fluid from both the anterior medial anterolateral approaches.  My suspicion is that he has really thickened synovium which is making aspiration more difficult.  The fluid that we did obtain was sent to the lab for cell count and differential and culture.  Fortunately other than the swelling he really does not present with any infection.  He does not have any pain or any fever.  We will notify the patient of the aspiration results.  He understands that this will require surgery if there is any suspicion of infection.  Follow-Up Instructions: No follow-ups on file.   Orders:  No orders of the defined types were placed in this encounter.  No orders of the defined types were placed in this encounter.     Procedures: Medium Joint Inj: L ankle on 05/24/2019 9:02 AM Indications: pain Details: 22 G needle Medications: 1 mL lidocaine 1 %; 1 mL bupivacaine 0.5 % Aspirate: 7 mL bloody; sent for lab analysis Outcome: tolerated well, no immediate complications Patient was prepped and draped in the usual sterile fashion.       Clinical Data: No additional findings.   Subjective: Chief Complaint  Patient presents with  . Left Ankle - Pain, Follow-up    George Aguilar returns today for MRI review.  He continues to deny any constitutional symptoms.  This all  started when he acutely twisted his ankle on April 06, 2019 which subsequently did not get better.  He continued to work with this injury.   Review of Systems   Objective: Vital Signs: BP 124/72   Pulse 88   Temp 97.7 F (36.5 C)   Physical Exam  Ortho Exam Left ankle exam shows no cellulitis or warmth.  No pain with ankle range of motion.  There is ankle effusion. Specialty Comments:  No specialty comments available.  Imaging: No results found.   PMFS History: Patient Active Problem List   Diagnosis Date Noted  . Left ankle sprain 04/14/2019  . Benign paroxysmal positional vertigo 07/09/2018  . Skin nodule 04/30/2016  . Pain in joint of right shoulder 04/19/2015  . Erectile dysfunction 04/26/2014  . Annual physical exam 10/31/2011  . HYPERCHOLESTEROLEMIA 07/09/2006  . MIGRAINE, UNSPEC., W/O INTRACTABLE MIGRAINE 07/09/2006  . Lumbago 07/09/2006   History reviewed. No pertinent past medical history.  Family History  Problem Relation Age of Onset  . Diabetes Mother   . Ovarian cancer Mother   . Diabetes Sister   . Diabetes Brother     History reviewed. No pertinent surgical history. Social History   Occupational History  . Occupation: maintenance   Tobacco Use  . Smoking status: Never Smoker  .  Smokeless tobacco: Never Used  Substance and Sexual Activity  . Alcohol use: Yes    Alcohol/week: 8.0 standard drinks    Types: 8 Cans of beer per week  . Drug use: No  . Sexual activity: Yes    Birth control/protection: Condom    Comment: Women

## 2019-05-24 NOTE — Addendum Note (Signed)
Addended by: Albertina Parr on: 05/24/2019 09:18 AM   Modules accepted: Orders

## 2019-05-25 ENCOUNTER — Other Ambulatory Visit (HOSPITAL_COMMUNITY)
Admission: RE | Admit: 2019-05-25 | Discharge: 2019-05-25 | Disposition: A | Discharge status: Home / Self Care | Payer: BC Managed Care – PPO | Source: Ambulatory Visit | Attending: Orthopaedic Surgery | Admitting: Orthopaedic Surgery

## 2019-05-25 ENCOUNTER — Other Ambulatory Visit: Payer: BC Managed Care – PPO

## 2019-05-25 ENCOUNTER — Encounter (HOSPITAL_COMMUNITY): Payer: Self-pay | Admitting: Orthopaedic Surgery

## 2019-05-25 ENCOUNTER — Other Ambulatory Visit: Payer: Self-pay

## 2019-05-25 DIAGNOSIS — M009 Pyogenic arthritis, unspecified: Secondary | ICD-10-CM | POA: Diagnosis not present

## 2019-05-25 DIAGNOSIS — Z79891 Long term (current) use of opiate analgesic: Secondary | ICD-10-CM | POA: Diagnosis not present

## 2019-05-25 DIAGNOSIS — E7800 Pure hypercholesterolemia, unspecified: Secondary | ICD-10-CM | POA: Diagnosis not present

## 2019-05-25 DIAGNOSIS — M94272 Chondromalacia, left ankle and joints of left foot: Secondary | ICD-10-CM | POA: Diagnosis not present

## 2019-05-25 DIAGNOSIS — Z01812 Encounter for preprocedural laboratory examination: Secondary | ICD-10-CM | POA: Insufficient documentation

## 2019-05-25 DIAGNOSIS — Z7952 Long term (current) use of systemic steroids: Secondary | ICD-10-CM | POA: Diagnosis not present

## 2019-05-25 DIAGNOSIS — M65872 Other synovitis and tenosynovitis, left ankle and foot: Secondary | ICD-10-CM | POA: Diagnosis not present

## 2019-05-25 DIAGNOSIS — Z833 Family history of diabetes mellitus: Secondary | ICD-10-CM | POA: Diagnosis not present

## 2019-05-25 DIAGNOSIS — Z79899 Other long term (current) drug therapy: Secondary | ICD-10-CM | POA: Diagnosis not present

## 2019-05-25 DIAGNOSIS — Z20822 Contact with and (suspected) exposure to covid-19: Secondary | ICD-10-CM | POA: Insufficient documentation

## 2019-05-25 DIAGNOSIS — Z9114 Patient's other noncompliance with medication regimen: Secondary | ICD-10-CM | POA: Diagnosis not present

## 2019-05-25 DIAGNOSIS — M00072 Staphylococcal arthritis, left ankle and foot: Secondary | ICD-10-CM | POA: Diagnosis not present

## 2019-05-25 DIAGNOSIS — D72819 Decreased white blood cell count, unspecified: Secondary | ICD-10-CM | POA: Diagnosis not present

## 2019-05-25 LAB — SARS CORONAVIRUS 2 (TAT 6-24 HRS): SARS Coronavirus 2: NEGATIVE

## 2019-05-25 LAB — SYNOVIAL CELL COUNT + DIFF, W/ CRYSTALS
Basophils, %: 0 %
Eosinophils-Synovial: 0 % (ref 0–2)
Lymphocytes-Synovial Fld: 2 % (ref 0–74)
Monocyte/Macrophage: 6 % (ref 0–69)
Neutrophil, Synovial: 92 % — ABNORMAL HIGH (ref 0–24)
Synoviocytes, %: 0 % (ref 0–15)
WBC, Synovial: 69650 cells/uL — ABNORMAL HIGH (ref <150)

## 2019-05-26 ENCOUNTER — Inpatient Hospital Stay (HOSPITAL_COMMUNITY): Payer: BC Managed Care – PPO | Admitting: Certified Registered Nurse Anesthetist

## 2019-05-26 ENCOUNTER — Encounter (HOSPITAL_COMMUNITY): Payer: Self-pay | Admitting: Orthopaedic Surgery

## 2019-05-26 ENCOUNTER — Other Ambulatory Visit: Payer: Self-pay

## 2019-05-26 ENCOUNTER — Encounter (HOSPITAL_COMMUNITY)
Admission: RE | Disposition: A | Discharge status: Home Health | Payer: Self-pay | Source: Home / Self Care | Attending: Orthopaedic Surgery

## 2019-05-26 ENCOUNTER — Inpatient Hospital Stay (HOSPITAL_COMMUNITY)
Admission: RE | Admit: 2019-05-26 | Discharge: 2019-05-28 | DRG: 493 | Disposition: A | Discharge status: Home Health | Payer: BC Managed Care – PPO | Attending: Orthopaedic Surgery | Admitting: Orthopaedic Surgery

## 2019-05-26 DIAGNOSIS — Z79899 Other long term (current) drug therapy: Secondary | ICD-10-CM

## 2019-05-26 DIAGNOSIS — Z20822 Contact with and (suspected) exposure to covid-19: Secondary | ICD-10-CM | POA: Diagnosis present

## 2019-05-26 DIAGNOSIS — D72819 Decreased white blood cell count, unspecified: Secondary | ICD-10-CM | POA: Diagnosis present

## 2019-05-26 DIAGNOSIS — Z833 Family history of diabetes mellitus: Secondary | ICD-10-CM | POA: Diagnosis not present

## 2019-05-26 DIAGNOSIS — M009 Pyogenic arthritis, unspecified: Secondary | ICD-10-CM | POA: Diagnosis present

## 2019-05-26 DIAGNOSIS — Z67A1 Duffy null: Secondary | ICD-10-CM | POA: Diagnosis present

## 2019-05-26 DIAGNOSIS — Z79891 Long term (current) use of opiate analgesic: Secondary | ICD-10-CM | POA: Diagnosis not present

## 2019-05-26 DIAGNOSIS — M65872 Other synovitis and tenosynovitis, left ankle and foot: Secondary | ICD-10-CM | POA: Diagnosis present

## 2019-05-26 DIAGNOSIS — Z9114 Patient's other noncompliance with medication regimen: Secondary | ICD-10-CM

## 2019-05-26 DIAGNOSIS — Z7952 Long term (current) use of systemic steroids: Secondary | ICD-10-CM

## 2019-05-26 DIAGNOSIS — M94272 Chondromalacia, left ankle and joints of left foot: Secondary | ICD-10-CM | POA: Diagnosis present

## 2019-05-26 DIAGNOSIS — M00072 Staphylococcal arthritis, left ankle and foot: Secondary | ICD-10-CM

## 2019-05-26 DIAGNOSIS — E7800 Pure hypercholesterolemia, unspecified: Secondary | ICD-10-CM | POA: Diagnosis present

## 2019-05-26 HISTORY — PX: I & D EXTREMITY: SHX5045

## 2019-05-26 LAB — CBC WITH DIFFERENTIAL/PLATELET
Abs Immature Granulocytes: 0.01 10*3/uL (ref 0.00–0.07)
Basophils Absolute: 0 10*3/uL (ref 0.0–0.1)
Basophils Relative: 1 %
Eosinophils Absolute: 0.1 10*3/uL (ref 0.0–0.5)
Eosinophils Relative: 2 %
HCT: 38.8 % — ABNORMAL LOW (ref 39.0–52.0)
Hemoglobin: 13.5 g/dL (ref 13.0–17.0)
Immature Granulocytes: 0 %
Lymphocytes Relative: 42 %
Lymphs Abs: 1.6 10*3/uL (ref 0.7–4.0)
MCH: 30.6 pg (ref 26.0–34.0)
MCHC: 34.8 g/dL (ref 30.0–36.0)
MCV: 88 fL (ref 80.0–100.0)
Monocytes Absolute: 0.3 10*3/uL (ref 0.1–1.0)
Monocytes Relative: 8 %
Neutro Abs: 1.8 10*3/uL (ref 1.7–7.7)
Neutrophils Relative %: 47 %
Platelets: 297 10*3/uL (ref 150–400)
RBC: 4.41 MIL/uL (ref 4.22–5.81)
RDW: 11.8 % (ref 11.5–15.5)
WBC: 3.9 10*3/uL — ABNORMAL LOW (ref 4.0–10.5)
nRBC: 0 % (ref 0.0–0.2)

## 2019-05-26 LAB — CREATININE, SERUM
Creatinine, Ser: 0.87 mg/dL (ref 0.61–1.24)
GFR calc Af Amer: >60 mL/min (ref >60)
GFR calc non Af Amer: >60 mL/min (ref >60)

## 2019-05-26 SURGERY — IRRIGATION AND DEBRIDEMENT EXTREMITY
Anesthesia: General | Laterality: Left

## 2019-05-26 MED ORDER — ACETAMINOPHEN 10 MG/ML IV SOLN: 1000.0000 mg | Freq: Once | INTRAVENOUS | Status: DC | PRN

## 2019-05-26 MED ORDER — LIDOCAINE 2% (20 MG/ML) 5 ML SYRINGE
INTRAMUSCULAR | Status: AC
  Filled 2019-05-26: qty 5

## 2019-05-26 MED ORDER — PHENYLEPHRINE 40 MCG/ML (10ML) SYRINGE FOR IV PUSH (FOR BLOOD PRESSURE SUPPORT)
PREFILLED_SYRINGE | INTRAVENOUS | Status: DC | PRN
  Administered 2019-05-26 (×2): 80 ug via INTRAVENOUS

## 2019-05-26 MED ORDER — ONDANSETRON HCL 4 MG/2ML IJ SOLN: 4.0000 mg | Freq: Four times a day (QID) | INTRAMUSCULAR | Status: DC | PRN

## 2019-05-26 MED ORDER — VANCOMYCIN HCL IN DEXTROSE 1-5 GM/200ML-% IV SOLN
1000.0000 mg | Freq: Two times a day (BID) | INTRAVENOUS | Status: DC
  Administered 2019-05-27 – 2019-05-28 (×4): 1000 mg via INTRAVENOUS
  Filled 2019-05-26 (×5): qty 200

## 2019-05-26 MED ORDER — METHOCARBAMOL 500 MG PO TABS
500.0000 mg | ORAL_TABLET | Freq: Four times a day (QID) | ORAL | Status: DC | PRN
  Administered 2019-05-26: 500 mg via ORAL

## 2019-05-26 MED ORDER — POLYETHYLENE GLYCOL 3350 17 G PO PACK: 17.0000 g | PACK | Freq: Every day | ORAL | Status: DC | PRN

## 2019-05-26 MED ORDER — GABAPENTIN 300 MG PO CAPS
300.0000 mg | ORAL_CAPSULE | Freq: Three times a day (TID) | ORAL | Status: DC
  Administered 2019-05-26 – 2019-05-28 (×7): 300 mg via ORAL
  Filled 2019-05-26 (×7): qty 1

## 2019-05-26 MED ORDER — FENTANYL CITRATE (PF) 100 MCG/2ML IJ SOLN
INTRAMUSCULAR | Status: DC | PRN
  Administered 2019-05-26 (×2): 25 ug via INTRAVENOUS
  Administered 2019-05-26: 50 ug via INTRAVENOUS
  Administered 2019-05-26 (×2): 25 ug via INTRAVENOUS

## 2019-05-26 MED ORDER — EPHEDRINE 5 MG/ML INJ
INTRAVENOUS | Status: AC
  Filled 2019-05-26: qty 20

## 2019-05-26 MED ORDER — MIDAZOLAM HCL 5 MG/5ML IJ SOLN
INTRAMUSCULAR | Status: DC | PRN
  Administered 2019-05-26: 2 mg via INTRAVENOUS

## 2019-05-26 MED ORDER — METOCLOPRAMIDE HCL 5 MG PO TABS: 5.0000 mg | ORAL_TABLET | Freq: Three times a day (TID) | ORAL | Status: DC | PRN

## 2019-05-26 MED ORDER — DIPHENHYDRAMINE HCL 12.5 MG/5ML PO ELIX: 25.0000 mg | ORAL_SOLUTION | ORAL | Status: DC | PRN

## 2019-05-26 MED ORDER — KETOROLAC TROMETHAMINE 30 MG/ML IJ SOLN
INTRAMUSCULAR | Status: AC
  Filled 2019-05-26: qty 1

## 2019-05-26 MED ORDER — KETOROLAC TROMETHAMINE 30 MG/ML IJ SOLN
INTRAMUSCULAR | Status: DC | PRN
  Administered 2019-05-26: 30 mg via INTRAVENOUS

## 2019-05-26 MED ORDER — PROPOFOL 10 MG/ML IV BOLUS
INTRAVENOUS | Status: AC
  Filled 2019-05-26: qty 40

## 2019-05-26 MED ORDER — PROMETHAZINE HCL 25 MG/ML IJ SOLN: 6.2500 mg | INTRAMUSCULAR | Status: DC | PRN

## 2019-05-26 MED ORDER — HYDROMORPHONE HCL 1 MG/ML IJ SOLN
INTRAMUSCULAR | Status: AC
  Filled 2019-05-26: qty 1

## 2019-05-26 MED ORDER — ADULT MULTIVITAMIN W/MINERALS CH
1.0000 | ORAL_TABLET | Freq: Every day | ORAL | Status: DC
  Administered 2019-05-27 – 2019-05-28 (×2): 1 via ORAL
  Filled 2019-05-26 (×3): qty 1

## 2019-05-26 MED ORDER — MORPHINE SULFATE (PF) 2 MG/ML IV SOLN: 0.5000 mg | INTRAVENOUS | Status: DC | PRN

## 2019-05-26 MED ORDER — MAGNESIUM CITRATE PO SOLN: 1.0000 | Freq: Once | ORAL | Status: DC | PRN

## 2019-05-26 MED ORDER — HYDROMORPHONE HCL 1 MG/ML IJ SOLN
0.2500 mg | INTRAMUSCULAR | Status: DC | PRN
  Administered 2019-05-26: 0.25 mg via INTRAVENOUS

## 2019-05-26 MED ORDER — SODIUM CHLORIDE 0.9 % IR SOLN
Status: DC | PRN
  Administered 2019-05-26 (×2): 3000 mL

## 2019-05-26 MED ORDER — ACETAMINOPHEN 10 MG/ML IV SOLN
INTRAVENOUS | Status: AC
  Filled 2019-05-26: qty 100

## 2019-05-26 MED ORDER — MIDAZOLAM HCL 2 MG/2ML IJ SOLN
INTRAMUSCULAR | Status: AC
  Filled 2019-05-26: qty 2

## 2019-05-26 MED ORDER — SUCCINYLCHOLINE CHLORIDE 200 MG/10ML IV SOSY
PREFILLED_SYRINGE | INTRAVENOUS | Status: AC
  Filled 2019-05-26: qty 10

## 2019-05-26 MED ORDER — HYDROCODONE-ACETAMINOPHEN 7.5-325 MG PO TABS
1.0000 | ORAL_TABLET | ORAL | Status: DC | PRN
  Administered 2019-05-26: 2 via ORAL
  Administered 2019-05-27 – 2019-05-28 (×4): 1 via ORAL
  Filled 2019-05-26 (×4): qty 1

## 2019-05-26 MED ORDER — PROPOFOL 10 MG/ML IV BOLUS
INTRAVENOUS | Status: DC | PRN
  Administered 2019-05-26: 200 mg via INTRAVENOUS

## 2019-05-26 MED ORDER — LACTATED RINGERS IV SOLN: INTRAVENOUS | Status: DC

## 2019-05-26 MED ORDER — VANCOMYCIN HCL 1000 MG IV SOLR
INTRAVENOUS | Status: AC
  Filled 2019-05-26: qty 1000

## 2019-05-26 MED ORDER — VITAMIN E 45 MG (100 UNIT) PO CAPS
200.0000 [IU] | ORAL_CAPSULE | Freq: Every day | ORAL | Status: DC
  Administered 2019-05-26 – 2019-05-28 (×3): 200 [IU] via ORAL
  Filled 2019-05-26 (×3): qty 2

## 2019-05-26 MED ORDER — LIDOCAINE 2% (20 MG/ML) 5 ML SYRINGE
INTRAMUSCULAR | Status: DC | PRN
  Administered 2019-05-26: 60 mg via INTRAVENOUS

## 2019-05-26 MED ORDER — HYDROCODONE-ACETAMINOPHEN 5-325 MG PO TABS: 1.0000 | ORAL_TABLET | ORAL | Status: DC | PRN

## 2019-05-26 MED ORDER — PHENYLEPHRINE 40 MCG/ML (10ML) SYRINGE FOR IV PUSH (FOR BLOOD PRESSURE SUPPORT)
PREFILLED_SYRINGE | INTRAVENOUS | Status: AC
  Filled 2019-05-26: qty 20

## 2019-05-26 MED ORDER — ONDANSETRON HCL 4 MG/2ML IJ SOLN
INTRAMUSCULAR | Status: AC
  Filled 2019-05-26: qty 4

## 2019-05-26 MED ORDER — HYDROCODONE-ACETAMINOPHEN 7.5-325 MG PO TABS
ORAL_TABLET | ORAL | Status: AC
  Filled 2019-05-26: qty 2

## 2019-05-26 MED ORDER — ACETAMINOPHEN 500 MG PO TABS
500.0000 mg | ORAL_TABLET | Freq: Four times a day (QID) | ORAL | Status: AC
  Administered 2019-05-27 (×3): 500 mg via ORAL
  Filled 2019-05-26 (×3): qty 1

## 2019-05-26 MED ORDER — VANCOMYCIN HCL IN DEXTROSE 1-5 GM/200ML-% IV SOLN
INTRAVENOUS | Status: AC
  Filled 2019-05-26: qty 200

## 2019-05-26 MED ORDER — FENTANYL CITRATE (PF) 250 MCG/5ML IJ SOLN
INTRAMUSCULAR | Status: AC
  Filled 2019-05-26: qty 5

## 2019-05-26 MED ORDER — ONDANSETRON HCL 4 MG/2ML IJ SOLN
INTRAMUSCULAR | Status: DC | PRN
  Administered 2019-05-26: 4 mg via INTRAVENOUS

## 2019-05-26 MED ORDER — ACETAMINOPHEN 10 MG/ML IV SOLN
INTRAVENOUS | Status: DC | PRN
  Administered 2019-05-26: 1000 mg via INTRAVENOUS

## 2019-05-26 MED ORDER — METHOCARBAMOL 1000 MG/10ML IJ SOLN
500.0000 mg | Freq: Four times a day (QID) | INTRAVENOUS | Status: DC | PRN
  Filled 2019-05-26: qty 5

## 2019-05-26 MED ORDER — VANCOMYCIN HCL 1500 MG/300ML IV SOLN
1500.0000 mg | Freq: Once | INTRAVENOUS | Status: AC
  Administered 2019-05-26: 1500 mg via INTRAVENOUS
  Filled 2019-05-26: qty 300

## 2019-05-26 MED ORDER — CHLORHEXIDINE GLUCONATE 4 % EX LIQD: 60.0000 mL | Freq: Once | CUTANEOUS | Status: DC

## 2019-05-26 MED ORDER — DEXAMETHASONE SODIUM PHOSPHATE 10 MG/ML IJ SOLN
INTRAMUSCULAR | Status: AC
  Filled 2019-05-26: qty 2

## 2019-05-26 MED ORDER — B COMPLEX-C PO TABS
1.0000 | ORAL_TABLET | Freq: Every day | ORAL | Status: DC
  Administered 2019-05-26 – 2019-05-28 (×3): 1 via ORAL
  Filled 2019-05-26 (×3): qty 1

## 2019-05-26 MED ORDER — DEXAMETHASONE SODIUM PHOSPHATE 4 MG/ML IJ SOLN
INTRAMUSCULAR | Status: DC | PRN
  Administered 2019-05-26: 10 mg via INTRAVENOUS

## 2019-05-26 MED ORDER — METHOCARBAMOL 500 MG PO TABS
ORAL_TABLET | ORAL | Status: AC
  Filled 2019-05-26: qty 1

## 2019-05-26 MED ORDER — METOCLOPRAMIDE HCL 5 MG/ML IJ SOLN: 5.0000 mg | Freq: Three times a day (TID) | INTRAMUSCULAR | Status: DC | PRN

## 2019-05-26 MED ORDER — SORBITOL 70 % SOLN: 30.0000 mL | Freq: Every day | Status: DC | PRN

## 2019-05-26 MED ORDER — VANCOMYCIN HCL 1000 MG IV SOLR
INTRAVENOUS | Status: DC | PRN
  Administered 2019-05-26: 1000 mg via INTRAVENOUS

## 2019-05-26 MED ORDER — OXYCODONE HCL 5 MG/5ML PO SOLN: 5.0000 mg | Freq: Once | ORAL | Status: DC | PRN

## 2019-05-26 MED ORDER — DOCUSATE SODIUM 100 MG PO CAPS
100.0000 mg | ORAL_CAPSULE | Freq: Two times a day (BID) | ORAL | Status: DC
  Administered 2019-05-26 – 2019-05-28 (×4): 100 mg via ORAL
  Filled 2019-05-26 (×4): qty 1

## 2019-05-26 MED ORDER — KETOROLAC TROMETHAMINE 30 MG/ML IJ SOLN
INTRAMUSCULAR | Status: AC
  Filled 2019-05-26: qty 2

## 2019-05-26 MED ORDER — KETOROLAC TROMETHAMINE 30 MG/ML IJ SOLN
30.0000 mg | Freq: Once | INTRAMUSCULAR | Status: AC
  Administered 2019-05-26: 30 mg via INTRAVENOUS

## 2019-05-26 MED ORDER — SODIUM CHLORIDE 0.9 % IV SOLN: INTRAVENOUS | Status: DC

## 2019-05-26 MED ORDER — ONDANSETRON HCL 4 MG PO TABS: 4.0000 mg | ORAL_TABLET | Freq: Four times a day (QID) | ORAL | Status: DC | PRN

## 2019-05-26 MED ORDER — VASOPRESSIN 20 UNIT/ML IV SOLN
INTRAVENOUS | Status: AC
  Filled 2019-05-26: qty 1

## 2019-05-26 MED ORDER — ACETAMINOPHEN 325 MG PO TABS: 325.0000 mg | ORAL_TABLET | Freq: Four times a day (QID) | ORAL | Status: DC | PRN

## 2019-05-26 MED ORDER — OXYCODONE HCL 5 MG PO TABS: 5.0000 mg | ORAL_TABLET | Freq: Once | ORAL | Status: DC | PRN

## 2019-05-26 SURGICAL SUPPLY — 54 items
BNDG COHESIVE 4X5 TAN STRL (GAUZE/BANDAGES/DRESSINGS) ×2 IMPLANT
BNDG COHESIVE 6X5 TAN STRL LF (GAUZE/BANDAGES/DRESSINGS) ×4 IMPLANT
BNDG ELASTIC 3X5.8 VLCR STR LF (GAUZE/BANDAGES/DRESSINGS) IMPLANT
BNDG ELASTIC 4X5.8 VLCR STR LF (GAUZE/BANDAGES/DRESSINGS) ×2 IMPLANT
BNDG GAUZE ELAST 4 BULKY (GAUZE/BANDAGES/DRESSINGS) ×2 IMPLANT
COVER SURGICAL LIGHT HANDLE (MISCELLANEOUS) ×2 IMPLANT
COVER WAND RF STERILE (DRAPES) ×2 IMPLANT
CUFF TOURN SGL QUICK 24 (TOURNIQUET CUFF)
CUFF TOURN SGL QUICK 34 (TOURNIQUET CUFF) ×2
CUFF TOURN SGL QUICK 42 (TOURNIQUET CUFF) IMPLANT
CUFF TRNQT CYL 24X4X16.5-23 (TOURNIQUET CUFF) IMPLANT
CUFF TRNQT CYL 34X4.125X (TOURNIQUET CUFF) IMPLANT
DRAPE U-SHAPE 47X51 STRL (DRAPES) ×2 IMPLANT
DRSG PAD ABDOMINAL 8X10 ST (GAUZE/BANDAGES/DRESSINGS) ×2 IMPLANT
DURAPREP 26ML APPLICATOR (WOUND CARE) ×2 IMPLANT
ELECT REM PT RETURN 9FT ADLT (ELECTROSURGICAL)
ELECTRODE REM PT RTRN 9FT ADLT (ELECTROSURGICAL) IMPLANT
EVACUATOR 1/8 PVC DRAIN (DRAIN) ×1 IMPLANT
GAUZE SPONGE 4X4 12PLY STRL (GAUZE/BANDAGES/DRESSINGS) ×3 IMPLANT
GAUZE XEROFORM 5X9 LF (GAUZE/BANDAGES/DRESSINGS) ×2 IMPLANT
GLOVE BIOGEL PI IND STRL 7.0 (GLOVE) ×1 IMPLANT
GLOVE BIOGEL PI INDICATOR 7.0 (GLOVE) ×1
GLOVE ECLIPSE 7.0 STRL STRAW (GLOVE) ×2 IMPLANT
GLOVE SKINSENSE NS SZ7.5 (GLOVE) ×2
GLOVE SKINSENSE STRL SZ7.5 (GLOVE) ×2 IMPLANT
GOWN STRL REIN XL XLG (GOWN DISPOSABLE) ×4 IMPLANT
HANDPIECE INTERPULSE COAX TIP (DISPOSABLE)
KIT BASIN OR (CUSTOM PROCEDURE TRAY) ×2 IMPLANT
KIT TURNOVER KIT B (KITS) ×2 IMPLANT
MANIFOLD NEPTUNE II (INSTRUMENTS) ×2 IMPLANT
PACK ORTHO EXTREMITY (CUSTOM PROCEDURE TRAY) ×2 IMPLANT
PAD ABD 8X10 STRL (GAUZE/BANDAGES/DRESSINGS) ×1 IMPLANT
PAD ARMBOARD 7.5X6 YLW CONV (MISCELLANEOUS) ×4 IMPLANT
PAD CAST 4YDX4 CTTN HI CHSV (CAST SUPPLIES) IMPLANT
PADDING CAST ABS 4INX4YD NS (CAST SUPPLIES) ×1
PADDING CAST ABS COTTON 4X4 ST (CAST SUPPLIES) ×1 IMPLANT
PADDING CAST COTTON 4X4 STRL (CAST SUPPLIES) ×2
PADDING CAST COTTON 6X4 STRL (CAST SUPPLIES) ×2 IMPLANT
SET HNDPC FAN SPRY TIP SCT (DISPOSABLE) IMPLANT
SHAVER DISSECTOR 3.0 (BURR) ×1 IMPLANT
SPONGE LAP 18X18 RF (DISPOSABLE) ×2 IMPLANT
STOCKINETTE IMPERVIOUS 9X36 MD (GAUZE/BANDAGES/DRESSINGS) ×2 IMPLANT
SUT ETHILON 2 0 FS 18 (SUTURE) ×2 IMPLANT
SUT ETHILON 2 0 PSLX (SUTURE) IMPLANT
SUT ETHILON 4 0 PS 2 18 (SUTURE) ×1 IMPLANT
SUT VIC AB 2-0 FS1 27 (SUTURE) IMPLANT
SWAB CULTURE ESWAB REG 1ML (MISCELLANEOUS) IMPLANT
TOWEL GREEN STERILE (TOWEL DISPOSABLE) ×2 IMPLANT
TOWEL GREEN STERILE FF (TOWEL DISPOSABLE) ×2 IMPLANT
TUBE CONNECTING 12X1/4 (SUCTIONS) ×2 IMPLANT
TUBING ARTHROSCOPY IRRIG 16FT (MISCELLANEOUS) ×2 IMPLANT
UNDERPAD 30X30 (UNDERPADS AND DIAPERS) ×4 IMPLANT
WATER STERILE IRR 1000ML POUR (IV SOLUTION) ×2 IMPLANT
YANKAUER SUCT BULB TIP NO VENT (SUCTIONS) ×2 IMPLANT

## 2019-05-26 NOTE — Anesthesia Postprocedure Evaluation (Signed)
Anesthesia Post Note  Patient: George Aguilar  Procedure(s) Performed: ARTHOSCOPIC WASHOUT LEFT ANKLE (Left )     Patient location during evaluation: PACU Anesthesia Type: General Level of consciousness: awake and alert Pain management: pain level controlled Vital Signs Assessment: post-procedure vital signs reviewed and stable Respiratory status: spontaneous breathing, nonlabored ventilation, respiratory function stable and patient connected to nasal cannula oxygen Cardiovascular status: blood pressure returned to baseline and stable Postop Assessment: no apparent nausea or vomiting Anesthetic complications: no    Last Vitals:  Vitals:   05/26/19 1706 05/26/19 1715  BP:  132/90  Pulse: 66 63  Resp: 12 16  Temp:    SpO2: 97% 100%    Last Pain:  Vitals:   05/26/19 1730  TempSrc:   PainSc: 8                  Ryllie Nieland P Randy Whitener

## 2019-05-26 NOTE — Progress Notes (Signed)
Orthopedic Tech Progress Note Patient Details:  George Aguilar 10-Apr-1956 [id number]  Ortho Devices Type of Ortho Device: CAM walker Ortho Device/Splint Interventions: Application   Post Interventions Patient Tolerated: Well Instructions Provided: Care of device   Saul Fordyce 05/26/2019, 4:41 PM

## 2019-05-26 NOTE — Op Note (Signed)
   Date of Surgery: 05/26/2019  INDICATIONS: Mr. Hoe is a 64 y.o.-year-old male with a left ankle septic arthritsi;  The patient did consent to the procedure after discussion of the risks and benefits.  PREOPERATIVE DIAGNOSIS: Septic arthritis left ankle  POSTOPERATIVE DIAGNOSIS: Same.  PROCEDURE: Arthroscopic left ankle irrigation and debridement with synovectomy  SURGEON: N. Glee Arvin, M.D.  ASSIST: Starlyn Skeans [place], [place]; necessary for the timely completion of procedure and due to complexity of procedure.  ANESTHESIA:  general  IV FLUIDS AND URINE: See anesthesia.  ESTIMATED BLOOD LOSS: Minimal mL.  IMPLANTS: None  DRAINS: Hemovac  COMPLICATIONS: see description of procedure.  DESCRIPTION OF PROCEDURE: The patient was brought to the operating room and placed supine on the operating table.  The patient had been signed prior to the procedure and this was documented. The patient had the anesthesia placed by the anesthesiologist.  A time-out was performed to confirm that this was the correct patient, site, side and location. The patient did receive antibiotics after cultures were obtained.  A tourniquet was placed on the upper thigh.  The patient had the operative extremity prepped and draped in the standard surgical fashion.    Anterior medial and anterolateral ankle arthroscopy portals were established.  The ankle was distracted with the provided device to improve visualization.  The ankle joint was insufflated with 20 cc of normal saline.  Once inside the joint we performed a full synovectomy using oscillating shaver.  2 samples of the synovium was were obtained for culture.  The distal tibial plafond surface demonstrated grade II chondromalacia.  The talar dome itself was intact.  I did not encounter any frank pus.  6 L of normal saline was irrigated throughout the joint.  A medium Hemovac was then placed in the joint using arthroscopic visualization.  The portals were then  closed with interrupted nylon sutures.  Sterile dressings were applied.  Patient tolerated procedure well had no me complications.  POSTOPERATIVE PLAN: Patient will be admitted for IV antibiotics.  Mayra Reel, MD 4:14 PM

## 2019-05-26 NOTE — Progress Notes (Signed)
Pharmacy Antibiotic Note  George Aguilar is a 64 y.o. male admitted on 05/26/2019 with septic arthritis of left ankle.  Pt is S/P arthroscopic left ankle irrigation and debridement with synovectomy this afternoon. Pharmacy has been consulted for vancomycin dosing for wound infection.  Pt rec'd no antibiotics prior to procedure today; surgical cx (fluid) sent to microbiology lab.  WBC 3.9, afebrile; Scr 0.87, CrCl 82.3; joint fluid: 69,650 WBC (92% neutrophils)  Plan: Vancomycin 1500 mg IV X 1, followed by vancomycin 1 gm IV Q 12 hrs (estimated vancomycin AUC on this regimen, using Scr 0.87, is 510.7; goal vancomycin AUC is 400-550) Monitor renal function, WBC, temp, clinical improvement, vancomycin levels  Height: 5\' 7"  (170.2 cm) Weight: 165 lb (74.8 kg) IBW/kg (Calculated) : 66.1  Temp (24hrs), Avg:98 F (36.7 C), Min:97.7 F (36.5 C), Max:98.6 F (37 C)  Recent Labs  Lab 05/26/19 1348  WBC 3.9*    Estimated Creatinine Clearance: 82.3 mL/min (by C-G formula based on SCr of 0.87 mg/dL).    No Known Allergies   Microbiology results: 1/14 surgical cx (fluid): pending 1/13: COVID: negative  Thank you for allowing pharmacy to be a part of this patient's care.  2/13, PharmD, BCPS, [facility] Clinical Pharmacist 05/26/2019 5:53 PM

## 2019-05-26 NOTE — Transfer of Care (Signed)
Immediate Anesthesia Transfer of Care Note  Patient: George Aguilar  Procedure(s) Performed: ARTHOSCOPIC WASHOUT LEFT ANKLE (Left )  Patient Location: PACU  Anesthesia Type:General  Level of Consciousness: awake, alert  and oriented  Airway & Oxygen Therapy: Patient Spontanous Breathing and Patient connected to face mask oxygen  Post-op Assessment: Report given to RN and Post -op Vital signs reviewed and stable  Post vital signs: Reviewed and stable  Last Vitals:  Vitals Value Taken Time  BP 136/78 05/26/19 1625  Temp 36.5 C 05/26/19 1625  Pulse 66 05/26/19 1630  Resp 14 05/26/19 1630  SpO2 100 % 05/26/19 1630  Vitals shown include unvalidated device data.  Last Pain:  Vitals:   05/26/19 1334  TempSrc:   PainSc: 0-No pain         Complications: No apparent anesthesia complications

## 2019-05-26 NOTE — H&P (Signed)
PREOPERATIVE H&P  Chief Complaint: septic left ankle  HPI: George Aguilar is a 64 y.o. male who presents for surgical treatment of septic left ankle.  He denies any changes in medical history.  No past medical history on file. Past Surgical History:  Procedure Laterality Date  . COLONOSCOPY     Social History   Socioeconomic History  . Marital status: Married    Spouse name: Not on file  . Number of children: Not on file  . Years of education: Not on file  . Highest education level: Not on file  Occupational History  . Occupation: maintenance   Tobacco Use  . Smoking status: Never Smoker  . Smokeless tobacco: Never Used  Substance and Sexual Activity  . Alcohol use: Not Currently  . Drug use: No  . Sexual activity: Yes    Birth control/protection: Condom    Comment: Women  Other Topics Concern  . Not on file  Social History Narrative  . Not on file   Social Determinants of Health   Financial Resource Strain:   . Difficulty of Paying Living Expenses: Not on file  Food Insecurity:   . Worried About Charity fundraiser in the Last Year: Not on file  . Ran Out of Food in the Last Year: Not on file  Transportation Needs:   . Lack of Transportation (Medical): Not on file  . Lack of Transportation (Non-Medical): Not on file  Physical Activity:   . Days of Exercise per Week: Not on file  . Minutes of Exercise per Session: Not on file  Stress:   . Feeling of Stress : Not on file  Social Connections:   . Frequency of Communication with Friends and Family: Not on file  . Frequency of Social Gatherings with Friends and Family: Not on file  . Attends Religious Services: Not on file  . Active Member of Clubs or Organizations: Not on file  . Attends Archivist Meetings: Not on file  . Marital Status: Not on file   Family History  Problem Relation Age of Onset  . Diabetes Mother   . Ovarian cancer Mother   . Diabetes Sister   . Diabetes Brother    No  Known Allergies Prior to Admission medications   Medication Sig Start Date End Date Taking? Authorizing Provider  Ascorbic Acid (VITAMIN C PO) Take 1 tablet by mouth daily.   Yes [provider]  b complex vitamins tablet Take 1 tablet by mouth daily.   Yes [provider]  ibuprofen (ADVIL) 200 MG tablet Take 200 mg by mouth every 4 (four) hours as needed for moderate pain.   Yes [provider]  MAGNESIUM PO Take 1 tablet by mouth every 3 (three) days.   Yes [provider]  Multiple Vitamin (MULTIVITAMIN WITH MINERALS) TABS tablet Take 1 tablet by mouth daily.   Yes [provider]  Multiple Vitamins-Minerals (ZINC PO) Take 1 tablet by mouth daily.   Yes [provider]  tadalafil (CIALIS) 5 MG tablet Take 0.5 tablets (2.5 mg total) by mouth every other day as needed for erectile dysfunction. 04/19/19 02/13/20 Yes Benay Pike, MD  vitamin E 200 UNIT capsule Take 200 Units by mouth daily.   Yes [provider]  HYDROcodone-acetaminophen (NORCO/VICODIN) 5-325 MG tablet Take 1 tablet by mouth every 6 (six) hours as needed for severe pain. Patient not taking: Reported on 05/25/2019 04/06/19   Ripley Fraise, MD  ibuprofen (  ADVIL) 800 MG tablet Take 1 tablet (800 mg total) by mouth every 8 (eight) hours as needed. Patient not taking: Reported on 05/25/2019 04/14/19   Tarry Kos, MD  meclizine (ANTIVERT) 25 MG tablet Take 1 tablet (25 mg total) by mouth 3 (three) times daily as needed for dizziness. Patient not taking: Reported on 05/25/2019 07/09/18   Latrelle Dodrill, MD  predniSONE (DELTASONE) 50 MG tablet One tablet po daily for 4 days Patient not taking: Reported on 05/25/2019 04/06/19   Zadie Rhine, MD  traMADol (ULTRAM) 50 MG tablet Take 1-2 tablets (50-100 mg total) by mouth daily as needed. Patient not taking: Reported on 05/25/2019 04/14/19   Tarry Kos, MD     Positive ROS: All other systems have been reviewed  and were otherwise negative with the exception of those mentioned in the HPI and as above.  Physical Exam: General: Alert, no acute distress Cardiovascular: No pedal edema Respiratory: No cyanosis, no use of accessory musculature GI: abdomen soft Skin: No lesions in the area of chief complaint Neurologic: Sensation intact distally Psychiatric: Patient is competent for consent with normal mood and affect Lymphatic: no lymphedema  MUSCULOSKELETAL: exam stable  Assessment: septic left ankle  Plan: Plan for Procedure(s): ARTHOSCOPIC WASHOUT LEFT ANKLE  The risks benefits and alternatives were discussed with the patient including but not limited to the risks of nonoperative treatment, versus surgical intervention including infection, bleeding, nerve injury,  blood clots, cardiopulmonary complications, morbidity, mortality, among others, and they were willing to proceed.   Glee Arvin, MD   05/26/2019 8:10 AM

## 2019-05-26 NOTE — Anesthesia Procedure Notes (Signed)
Procedure Name: LMA Insertion Date/Time: 05/26/2019 3:10 PM Performed by: Jed Limerick, CRNA Pre-anesthesia Checklist: Patient identified, Emergency Drugs available, Suction available and Patient being monitored Patient Re-evaluated:Patient Re-evaluated prior to induction Oxygen Delivery Method: Circle System Utilized Preoxygenation: Pre-oxygenation with 100% oxygen Induction Type: IV induction Ventilation: Mask ventilation without difficulty LMA: LMA inserted LMA Size: 4.0 Number of attempts: 1 Placement Confirmation: positive ETCO2 Tube secured with: Tape Dental Injury: Teeth and Oropharynx as per pre-operative assessment

## 2019-05-26 NOTE — Anesthesia Preprocedure Evaluation (Addendum)
Anesthesia Evaluation  Patient identified by MRN, date of birth, ID band Patient awake    Reviewed: Allergy & Precautions, NPO status , Patient's Chart, lab work & pertinent test results  Airway Mallampati: III  TM Distance: >3 FB Neck ROM: Full    Dental  (+) Upper Dentures, Partial Lower   Pulmonary neg pulmonary ROS,    Pulmonary exam normal breath sounds clear to auscultation       Cardiovascular negative cardio ROS Normal cardiovascular exam Rhythm:Regular Rate:Normal     Neuro/Psych  Headaches, negative psych ROS   GI/Hepatic negative GI ROS, Neg liver ROS,   Endo/Other  negative endocrine ROS  Renal/GU negative Renal ROS     Musculoskeletal negative musculoskeletal ROS (+)   Abdominal   Peds  Hematology negative hematology ROS (+)   Anesthesia Other Findings septic left ankle  Reproductive/Obstetrics                            Anesthesia Physical Anesthesia Plan  ASA: II  Anesthesia Plan: General   Post-op Pain Management:    Induction: Intravenous  PONV Risk Score and Plan: 2 and Ondansetron, Dexamethasone, Midazolam and Treatment may vary due to age or medical condition  Airway Management Planned: LMA  Additional Equipment:   Intra-op Plan:   Post-operative Plan: Extubation in OR  Informed Consent: I have reviewed the patients History and Physical, chart, labs and discussed the procedure including the risks, benefits and alternatives for the proposed anesthesia with the patient or authorized representative who has indicated his/her understanding and acceptance.     Dental advisory given  Plan Discussed with: CRNA  Anesthesia Plan Comments:        Anesthesia Quick Evaluation

## 2019-05-27 ENCOUNTER — Ambulatory Visit: Payer: BC Managed Care – PPO | Admitting: Orthopaedic Surgery

## 2019-05-27 ENCOUNTER — Inpatient Hospital Stay: Payer: Self-pay

## 2019-05-27 DIAGNOSIS — D72819 Decreased white blood cell count, unspecified: Secondary | ICD-10-CM | POA: Diagnosis present

## 2019-05-27 DIAGNOSIS — M009 Pyogenic arthritis, unspecified: Secondary | ICD-10-CM

## 2019-05-27 DIAGNOSIS — Z67A1 Duffy null: Secondary | ICD-10-CM | POA: Insufficient documentation

## 2019-05-27 MED ORDER — SODIUM CHLORIDE 0.9 % IV SOLN
2.0000 g | INTRAVENOUS | Status: DC
  Administered 2019-05-27 – 2019-05-28 (×2): 2 g via INTRAVENOUS
  Filled 2019-05-27 (×2): qty 20

## 2019-05-27 NOTE — Plan of Care (Signed)
  Problem: Activity: Goal: Ability to avoid complications of mobility impairment will improve 05/27/2019 1051 by Macaila Tahir, Thomes Cake, RN Outcome: Progressing 05/27/2019 1050 by Theo Dills, RN Outcome: Progressing Goal: Ability to tolerate increased activity will improve Outcome: Progressing   Problem: Education: Goal: Verbalization of understanding the information provided will improve 05/27/2019 1051 by Jayvion Stefanski, Thomes Cake, RN Outcome: Progressing 05/27/2019 1050 by Theo Dills, RN Outcome: Progressing   Problem: Physical Regulation: Goal: Postoperative complications will be avoided or minimized 05/27/2019 1051 by Theo Dills, RN Outcome: Progressing 05/27/2019 1050 by Theo Dills, RN Outcome: Progressing   Problem: Pain Management: Goal: Pain level will decrease 05/27/2019 1051 by Theo Dills, RN Outcome: Progressing 05/27/2019 1050 by Theo Dills, RN Outcome: Progressing   Problem: Skin Integrity: Goal: Signs of wound healing will improve Outcome: Progressing   Problem: Education: Goal: Knowledge of General Education information will improve Description: Including pain rating scale, medication(s)/side effects and non-pharmacologic comfort measures Outcome: Progressing   Problem: Health Behavior/Discharge Planning: Goal: Ability to manage health-related needs will improve Outcome: Progressing   Problem: Clinical Measurements: Goal: Will remain free from infection Outcome: Progressing   Problem: Activity: Goal: Risk for activity intolerance will decrease Outcome: Progressing   Problem: Pain Managment: Goal: General experience of comfort will improve Outcome: Progressing   Problem: Safety: Goal: Ability to remain free from injury will improve Outcome: Progressing   Problem: Skin Integrity: Goal: Risk for impaired skin integrity will decrease Outcome: Progressing

## 2019-05-27 NOTE — Progress Notes (Signed)
Subjective: 1 Day Post-Op Procedure(s) (LRB): ARTHOSCOPIC WASHOUT LEFT ANKLE (Left) Patient reports pain as mild.  Resting comfortably.  No fevers or chills.    Objective: Vital signs in last 24 hours: Temp:  [97.6 F (36.4 C)-98.6 F (37 C)] 97.9 F (36.6 C) (01/15 0746) Pulse Rate:  [63-85] 79 (01/15 0746) Resp:  [9-19] 18 (01/15 0746) BP: (122-148)/(75-90) 122/81 (01/15 0746) SpO2:  [96 %-100 %] 100 % (01/15 0746) Weight:  [74.8 kg] 74.8 kg (01/14 1322)  Intake/Output from previous day: 01/14 0701 - 01/15 0700 In: 2137.5 [I.V.:2137.5] Out: 710 [Urine:700; Blood:10] Intake/Output this shift: No intake/output data recorded.  Recent Labs    05/26/19 1348  HGB 13.5   Recent Labs    05/26/19 1348  WBC 3.9*  RBC 4.41  HCT 38.8*  PLT 297   Recent Labs    05/26/19 1808  CREATININE 0.87   No results for input(s): LABPT, INR in the last 72 hours.  Neurologically intact Neurovascular intact Sensation intact distally Intact pulses distally Dorsiflexion/Plantar flexion intact Incision: scant drainage No cellulitis present Compartment soft   Assessment/Plan: 1 Day Post-Op Procedure(s) (LRB): ARTHOSCOPIC WASHOUT LEFT ANKLE (Left) Up with therapy  Awaiting office/intra-op cx WBAT in cam walker LLE ID consulted Will pull hemovac drain this afternoon      Cristie Hem 05/27/2019, 8:25 AM

## 2019-05-27 NOTE — Evaluation (Signed)
Physical Therapy Evaluation Patient Details Name: George Aguilar MRN: [id number] DOB: 12/18/1955 Today's Date: 05/27/2019   History of Present Illness  Pt is a 64 y/o male s/p L ankle arthroscopic washout secondary to sepsis. No pertinent PMH.  Clinical Impression  Pt presented supine in bed with HOB elevated, awake and willing to participate in therapy session. Prior to admission, pt reported that he has been ambulating with use of crutches since pain onset approximately one month ago. He lives with his wife in a single level home with a level entry. At the time of evaluation, pt overall moving very well with use of bilateral axillary crutches. No physical assistance needed throughout and pt with no LOB. PT will continue to follow pt acutely to progress mobility as tolerated to ensure a safe d/c home.     Follow Up Recommendations No PT follow up    Equipment Recommendations  None recommended by PT    Recommendations for Other Services       Precautions / Restrictions Precautions Precautions: None Required Braces or Orthoses: Other Brace Other Brace: L LE CAM boot Restrictions Weight Bearing Restrictions: Yes LLE Weight Bearing: Weight bearing as tolerated      Mobility  Bed Mobility Overal bed mobility: Modified Independent                Transfers Overall transfer level: Modified independent                  Ambulation/Gait Ambulation/Gait assistance: Supervision Gait Distance (Feet): 40 Feet Assistive device: Crutches Gait Pattern/deviations: (swing-through on R LE) Gait velocity: decreased   General Gait Details: pt unable to tolerate any WB'ing in L LE secondary to pain; steady overall with use of bilateral axillary crutches. Cueing for [place] through hands on crutches versus axilla  Stairs            Wheelchair Mobility    Modified Rankin (Stroke Patients Only)       Balance Overall balance assessment: No apparent balance deficits  (not formally assessed)                                           Pertinent Vitals/Pain Pain Assessment: 0-10 Pain Score: 2  Pain Location: L ankle Pain Descriptors / Indicators: Sore Pain Intervention(s): Monitored during session;Repositioned    Home Living Family/patient expects to be discharged to:: Private residence Living Arrangements: Spouse/significant other Available Help at Discharge: Family;Available PRN/intermittently Type of Home: House Home Access: Level entry     Home Layout: One level Home Equipment: Crutches      Prior Function Level of Independence: Independent         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 [employer]        Extremity/Trunk Assessment   Upper Extremity Assessment Upper Extremity Assessment: Overall WFL for tasks assessed    Lower Extremity Assessment Lower Extremity Assessment: Overall WFL for tasks assessed;LLE deficits/detail LLE Deficits / Details: pt unable to tolerate any weight bearing secondary to pain; however, able to move LE against gravity for functional mobility LLE: Unable to fully assess due to pain    Cervical / Trunk Assessment Cervical / Trunk Assessment: Normal  Communication   Communication: No difficulties  Cognition Arousal/Alertness: Awake/alert Behavior During Therapy: WFL for tasks assessed/performed Overall Cognitive Status: Within Functional Limits for tasks assessed  General Comments      Exercises     Assessment/Plan    PT Assessment Patient needs continued PT services  PT Problem List Decreased balance;Decreased mobility;Decreased coordination;Decreased knowledge of use of DME;Decreased safety awareness;Decreased knowledge of precautions;Pain       PT Treatment Interventions DME instruction;Gait training;Stair training;Functional mobility training;Therapeutic activities;Therapeutic  exercise;Balance training;Neuromuscular re-education;Patient/family education    PT Goals (Current goals can be found in the Care Plan section)  Acute Rehab PT Goals Patient Stated Goal: decrease pain, return to work PT Goal Formulation: With patient Time For Goal Achievement: 06/10/19 Potential to Achieve Goals: Good    Frequency Min 3X/week   Barriers to discharge        Co-evaluation               AM-PAC PT "6 Clicks" Mobility  Outcome Measure Help needed turning from your back to your side while in a flat bed without using bedrails?: None Help needed moving from lying on your back to sitting on the side of a flat bed without using bedrails?: None Help needed moving to and from a bed to a chair (including a wheelchair)?: None Help needed standing up from a chair using your arms (e.g., wheelchair or bedside chair)?: None Help needed to walk in hospital room?: None Help needed climbing 3-5 steps with a railing? : A Little 6 Click Score: 23    End of Session   Activity Tolerance: Patient tolerated treatment well Patient left: in chair;with call bell/phone within reach Nurse Communication: Mobility status PT Visit Diagnosis: Other abnormalities of gait and mobility (R26.89);Pain Pain - Right/Left: Left Pain - part of body: Ankle and joints of foot    Time: [id number] PT Time Calculation (min) (ACUTE ONLY): 17 min   Charges:   PT Evaluation $PT Eval Low Complexity: 1 Low          Ginette Pitman, PT, DPT  Acute Rehabilitation Services Pager [phone] Office [phone]    Alessandra Bevels Phyllicia Dudek 05/27/2019, 8:39 AM

## 2019-05-27 NOTE — Consult Note (Signed)
[facility] for Infectious Disease    Date of Admission:  05/26/2019           Day 1 vancomycin       Reason for Consult: Left ankle septic arthritis    Referring Provider: Dr. Eduard Roux  Assessment: He has septic arthritis of his left ankle with negative stains and cultures (so far).  I discussed management options with him and recommended a 3-week course of IV antibiotics.  I will treat with vancomycin and ceftriaxone pending final cultures.  Plan: 1. PICC placement 2. Arrange outpatient IV vancomycin and ceftriaxone  3. I will have Dr. Dietrich Pates [facility] [facility] [phone]) check cultures this weekend and make any adjustments necessary Diagnosis: Left ankle septic arthritis  Culture Result: Negative so far  No Known Allergies  OPAT Orders Discharge antibiotics: Per pharmacy protocol vancomycin and ceftriaxone Aim for Vancomycin trough 15-20 or AUC 400-550 (unless otherwise indicated) Duration: 3 weeks End Date: 06/17/2019  [facility] Care Per Protocol:  Labs weekly while on IV antibiotics: _x_ CBC with differential _x_ BMP __ CMP _x_ CRP _x_ ESR _x_ Vancomycin trough __ CK  _x_ Please pull PIC at completion of IV antibiotics __ Please leave PIC in place until doctor has seen patient or been notified  Fax weekly labs to [phone]  Clinic Follow Up Appt: 06/15/2019  Principal Problem:   Septic arthritis of left ankle (HCC) Active Problems:   HYPERCHOLESTEROLEMIA   Chronic leukopenia   Scheduled Meds: . acetaminophen  500 mg Oral Q6H  . B-complex with vitamin C  1 tablet Oral Daily  . docusate sodium  100 mg Oral BID  . gabapentin  300 mg Oral TID  . multivitamin with minerals  1 tablet Oral Daily  . vitamin E  200 Units Oral Daily   Continuous Infusions: . sodium chloride 125 mL/hr at 05/27/19 0600  . methocarbamol (ROBAXIN) IV    . vancomycin 1,000 mg (05/27/19 0821)   PRN Meds:.acetaminophen, diphenhydrAMINE, HYDROcodone-acetaminophen,  HYDROcodone-acetaminophen, magnesium citrate, methocarbamol **OR** methocarbamol (ROBAXIN) IV, metoCLOPramide **OR** metoCLOPramide (REGLAN) injection, morphine injection, ondansetron **OR** ondansetron (ZOFRAN) IV, polyethylene glycol, sorbitol  HPI: George Aguilar is a 64 y.o. male who sprained his ankle when stepping out of his bathtub the week before Thanksgiving.  Initially, he did not think much of it but just before Thanksgiving he began to have increasing pain and swelling.  He was seen in the emergency department where plain x-ray was negative.  He was given crutches and told to follow-up with orthopedics if he was not better in 1 week.  He had persistent pain and swelling and followed up with Dr. Erlinda Hong.  He had gradual worsening and was scheduled for an MRI that was finally done last week.  It showed:  IMPRESSION 1. Large ankle joint effusion with severe synovitis, extensive periarticular marrow edema centered around the ankle joint and mild edema in the surrounding soft tissues. Overall findings are most concerning for septic arthritis until proven otherwise. An inflammatory arthropathy can have a similar appearance. Recommend further evaluation with an arthrocentesis.  He underwent outpatient arthrocentesis 3 days ago.  The fluid was indicative of infection but Dr. Erlinda Hong told me that cultures remain negative.  He was admitted yesterday and underwent arthroscopic washout.  Synovial fluid analyses showed 69,650 white blood cells of which 92% were segmented neutrophils.  No crystals were seen.  Synovial fluid Gram stain did not show any organisms and cultures are negative  so far.  He has not been on any antibiotics recently.  He works for [employer] and enjoys riding his bike.   Review of Systems: Review of Systems  Constitutional: Positive for chills and fever. Negative for diaphoresis and weight loss.  HENT: Negative for congestion and sore throat.   Respiratory: Negative for cough  and shortness of breath.   Cardiovascular: Negative for chest pain.  Gastrointestinal: Negative for abdominal pain, diarrhea, nausea and vomiting.  Genitourinary: Negative for dysuria.  Musculoskeletal: Positive for joint pain.    History reviewed. No pertinent past medical history.  Social History   Tobacco Use  . Smoking status: Never Smoker  . Smokeless tobacco: Never Used  Substance Use Topics  . Alcohol use: Not Currently  . Drug use: No    Family History  Problem Relation Age of Onset  . Diabetes Mother   . Ovarian cancer Mother   . Diabetes Sister   . Diabetes Brother    No Known Allergies  OBJECTIVE: Blood pressure 122/81, pulse 79, temperature 97.9 F (36.6 C), temperature source Oral, resp. rate 18, height '5\' 7"'  (1.702 m), weight 74.8 kg, SpO2 100 %.  Physical Exam Constitutional:      Comments: He is a very pleasant gentleman.  He is sitting up in a chair.  Musculoskeletal:     Comments: His left ankle is wrapped.     Lab Results Lab Results  Component Value Date   WBC 3.9 (L) 05/26/2019   HGB 13.5 05/26/2019   HCT 38.8 (L) 05/26/2019   MCV 88.0 05/26/2019   PLT 297 05/26/2019    Lab Results  Component Value Date   CREATININE 0.87 05/26/2019   BUN 11 07/09/2018   NA 141 07/09/2018   K 4.2 07/09/2018   CL 102 07/09/2018   CO2 22 07/09/2018    Lab Results  Component Value Date   ALT 27 07/09/2018   AST 22 07/09/2018   ALKPHOS 39 07/09/2018   BILITOT 0.6 07/09/2018     Microbiology: Recent Results (from the past 240 hour(s))  SARS CORONAVIRUS 2 (TAT 6-24 HRS) Nasopharyngeal Nasopharyngeal Swab     Status: None   Collection Time: 05/25/19  2:15 PM   Specimen: Nasopharyngeal Swab  Result Value Ref Range Status   SARS Coronavirus 2 NEGATIVE NEGATIVE Final    Comment: (NOTE) SARS-CoV-2 target nucleic acids are NOT DETECTED. The SARS-CoV-2 RNA is generally detectable in upper and lower respiratory specimens during the acute phase of  infection. Negative results do not preclude SARS-CoV-2 infection, do not rule out co-infections with other pathogens, and should not be used as the sole basis for treatment or other patient management decisions. Negative results must be combined with clinical observations, patient history, and epidemiological information. The expected result is Negative. Fact Sheet for Patients: SugarRoll.be Fact Sheet for Healthcare Providers: https://www.woods-mathews.com/ This test is not yet approved or cleared by the [place] FDA and  has been authorized for detection and/or diagnosis of SARS-CoV-2 by FDA under an Emergency Use Authorization (EUA). This EUA will remain  in effect (meaning this test can be used) for the duration of the COVID-19 declaration under Section 56 4(b)(1) of the Act, 21 U.S.C. section 360bbb-3(b)(1), unless the authorization is terminated or revoked sooner. Performed at [facility], [place] [address] [place], [place] [address]   Aerobic/Anaerobic Culture (surgical/deep wound)     Status: None (Preliminary result)   Collection Time: 05/26/19  3:47 PM   Specimen:  PATH Cytology Misc. fluid; Body Fluid  Result Value Ref Range Status   Specimen Description WOUND LEFT ANKLE  Final   Special Requests NONE  Final   Gram Stain   Final    NO WBC SEEN NO ORGANISMS SEEN Performed at [facility], [address] [place], [place] [address]    Culture PENDING  Incomplete   Report Status PENDING  Incomplete    Michel Bickers, MD [facility] [phone] pager   [phone] cell 05/27/2019, 9:45 AM

## 2019-05-27 NOTE — Progress Notes (Signed)
PHARMACY CONSULT NOTE FOR:  OUTPATIENT  PARENTERAL ANTIBIOTIC THERAPY (OPAT)  Indication: Septic arthritis  Regimen: Vancomycin 1000mg  IV q12h and Ceftriaxone 2g IV q24h End date: 06/17/2019  IV antibiotic discharge orders are pended. To discharging provider:  please sign these orders via discharge navigator,  Select New Orders & click on the button choice - Manage This Unsigned Work.     Thank you for allowing pharmacy to be a part of this patient's care.  08/15/2019 05/27/2019, 11:26 AM

## 2019-05-28 MED ORDER — HYDROCODONE-ACETAMINOPHEN 5-325 MG PO TABS: 1.0000 | ORAL_TABLET | Freq: Four times a day (QID) | ORAL | 0 refills | Status: DC | PRN

## 2019-05-28 MED ORDER — SODIUM CHLORIDE 0.9% FLUSH
10.0000 mL | INTRAVENOUS | Status: DC | PRN
  Administered 2019-05-28: 10 mL

## 2019-05-28 MED ORDER — CEFTRIAXONE IV (FOR PTA / DISCHARGE USE ONLY): 2.0000 g | INTRAVENOUS | 0 refills | Status: DC

## 2019-05-28 MED ORDER — CHLORHEXIDINE GLUCONATE CLOTH 2 % EX PADS
6.0000 | MEDICATED_PAD | Freq: Every day | CUTANEOUS | Status: DC
  Administered 2019-05-28: 6 via TOPICAL

## 2019-05-28 MED ORDER — HEPARIN SOD (PORK) LOCK FLUSH 100 UNIT/ML IV SOLN
250.0000 [IU] | INTRAVENOUS | Status: AC | PRN
  Administered 2019-05-28: 250 [IU]
  Filled 2019-05-28: qty 2.5

## 2019-05-28 MED ORDER — CHLORHEXIDINE GLUCONATE CLOTH 2 % EX PADS: 6.0000 | MEDICATED_PAD | Freq: Every day | CUTANEOUS | Status: DC

## 2019-05-28 MED ORDER — VANCOMYCIN IV (FOR PTA / DISCHARGE USE ONLY): 1000.0000 mg | Freq: Two times a day (BID) | INTRAVENOUS | 0 refills | Status: DC

## 2019-05-28 NOTE — Progress Notes (Signed)
      INFECTIOUS DISEASE ATTENDING ADDENDUM:   Date: 05/28/2019  Patient name: George Aguilar  Medical record number: [id number]  Date of birth: May 16, 1955   Cultures done in OR not yielding growth  Cultures done in Orthopedics office to quest but not showing up in Epic  In interim patient can be DC on vancomycin and ceftriaxone and I will followup cultures over weekend and Dr. Orvan Falconer on Monday.   Paulette Blanch Dam 05/28/2019, 3:02 PM

## 2019-05-28 NOTE — Progress Notes (Signed)
   Subjective: 2 Days Post-Op Procedure(s) (LRB): ARTHOSCOPIC WASHOUT LEFT ANKLE (Left) Patient reports pain as mild.    Objective: Vital signs in last 24 hours: Temp:  [97.6 F (36.4 C)-99.3 F (37.4 C)] 98.2 F (36.8 C) (01/16 0820) Pulse Rate:  [64-74] 73 (01/16 0820) Resp:  [18] 18 (01/16 0820) BP: (114-136)/(75-78) 114/77 (01/16 0820) SpO2:  [98 %-100 %] 99 % (01/16 0820)  Intake/Output from previous day: 01/15 0701 - 01/16 0700 In: 1478.8 [P.O.:960; I.V.:218.8; IV Piggyback:300] Out: 1600 [Urine:1600] Intake/Output this shift: No intake/output data recorded.  Recent Labs    05/26/19 1348  HGB 13.5   Recent Labs    05/26/19 1348  WBC 3.9*  RBC 4.41  HCT 38.8*  PLT 297   Recent Labs    05/26/19 1808  CREATININE 0.87   No results for input(s): LABPT, INR in the last 72 hours.  Neurologically intact No results found.  Assessment/Plan: 2 Days Post-Op Procedure(s) (LRB): ARTHOSCOPIC WASHOUT LEFT ANKLE (Left) Discharge home with home health with IV ABX . Does not need HHPT. Once [facility] ABX are ready for PICC line infusion at home he can be discharged.   George Aguilar 05/28/2019, 11:38 AM

## 2019-05-28 NOTE — Discharge Instructions (Signed)
Keep foot elevated. Use crutches. Leave dressing on . See Dr. Roda Shutters in one week

## 2019-05-28 NOTE — Progress Notes (Signed)
Patient discharging home. Discharge instructions explained to patient and he verbalized understanding. PICC flushed and capped by IV team. Received dose of IV Vanc prior to discharge. Packed all personal belongings. No further questions or concerns voiced.

## 2019-05-28 NOTE — TOC Transition Note (Signed)
Transition of Care [facility]) - CM/SW Discharge Note   Patient Details  Name: George Aguilar MRN: [id number] Date of Birth: 1955/08/02  Transition of Care [facility]) CM/SW Contact:  Deveron Furlong, RN 05/28/2019, 4:57 PM   Clinical Narrative:    Information sent to [facility] earlier in the day.  [facility] will see patient in the home at [facility] tomorrow morning.  Patient will receive evening dose of Vancomycin and will have home drugs delivered tonight.  Patient would prefer to do this rather than wait to d/c tomorrow.     Final next level of care: Home w Home Health Services Barriers to Discharge: No Barriers Identified   Discharge Plan and Services                DME Arranged: IV pump/equipment      HH Arranged: RN HH Agency: [facility]) Date HH Agency Contacted: 05/28/19 Time HH Agency Contacted: [id number] Representative spoke with at [facility] Agency: Jeronimo Greaves

## 2019-05-29 DIAGNOSIS — M00872 Arthritis due to other bacteria, left ankle and foot: Secondary | ICD-10-CM | POA: Diagnosis not present

## 2019-05-30 ENCOUNTER — Telehealth: Payer: Self-pay | Admitting: Infectious Disease

## 2019-05-30 NOTE — Telephone Encounter (Signed)
   Cultures unrevealing to date I will check on him again in a few days in clinic with Clydie Braun

## 2019-05-30 NOTE — Discharge Summary (Signed)
Patient ID: George Aguilar MRN: [id number] DOB/AGE: [birth year] 63 y.o.  Admit date: 05/26/2019 Discharge date: 05/30/2019  Admission Diagnoses:  Septic arthritis of left ankle [facility])  Discharge Diagnoses:  Principal Problem:   Septic arthritis of left ankle (HCC) Active Problems:   HYPERCHOLESTEROLEMIA   Chronic leukopenia   History reviewed. No pertinent past medical history.  Surgeries: Procedure(s): ARTHOSCOPIC WASHOUT LEFT ANKLE on 05/26/2019   Consultants (if any):   Discharged Condition: Improved  Hospital Course: George Aguilar is an 64 y.o. male who was admitted 05/26/2019 with a diagnosis of Septic arthritis of left ankle ([place]) and went to the operating room on 05/26/2019 and underwent the above named procedures.    He was given perioperative antibiotics:  Anti-infectives (From admission, onward)   Start     Dose/Rate Route Frequency Ordered Stop   05/28/19 0000  cefTRIAXone (ROCEPHIN) IVPB     2 g Intravenous Every 24 hours 05/28/19 1136 06/18/19 2359   05/28/19 0000  vancomycin IVPB     1,000 mg Intravenous Every 12 hours 05/28/19 1136 06/18/19 2359   05/27/19 1200  cefTRIAXone (ROCEPHIN) 2 g in sodium chloride 0.9 % 100 mL IVPB  Status:  Discontinued     2 g 200 mL/hr over 30 Minutes Intravenous Every 24 hours 05/27/19 1124 05/28/19 2359   05/27/19 0730  vancomycin (VANCOCIN) IVPB 1000 mg/200 mL premix  Status:  Discontinued     1,000 mg 200 mL/hr over 60 Minutes Intravenous Every 12 hours 05/26/19 1907 05/28/19 2359   05/26/19 1830  vancomycin (VANCOREADY) IVPB 1500 mg/300 mL     1,500 mg 150 mL/hr over 120 Minutes Intravenous  Once 05/26/19 1805 05/26/19 2129    .  He was given sequential compression devices, early ambulation, and appropriate chemoprophylaxis for DVT prophylaxis.  He benefited maximally from the hospital stay and there were no complications.    Recent vital signs:  Vitals:   05/28/19 0820 05/28/19 1430  BP: 114/77 131/78    Pulse: 73 79  Resp: 18 17  Temp: 98.2 F (36.8 C) 98.5 F (36.9 C)  SpO2: 99% 98%    Recent laboratory studies:  Lab Results  Component Value Date   HGB 13.5 05/26/2019   HGB 15.2 07/09/2018   HGB 15.2 04/15/2018   Lab Results  Component Value Date   WBC 3.9 (L) 05/26/2019   PLT 297 05/26/2019   No results found for: INR Lab Results  Component Value Date   NA 141 07/09/2018   K 4.2 07/09/2018   CL 102 07/09/2018   CO2 22 07/09/2018   BUN 11 07/09/2018   CREATININE 0.87 05/26/2019   GLUCOSE 108 (H) 07/09/2018    Discharge Medications:   Allergies as of 05/28/2019   No Known Allergies     Medication List    STOP taking these medications   meclizine 25 MG tablet Commonly known as: ANTIVERT   predniSONE 50 MG tablet Commonly known as: DELTASONE   tadalafil 5 MG tablet Commonly known as: CIALIS   traMADol 50 MG tablet Commonly known as: ULTRAM     TAKE these medications   b complex vitamins tablet Take 1 tablet by mouth daily.   cefTRIAXone  IVPB Commonly known as: ROCEPHIN Inject 2 g into the vein daily for 21 days. Indication:  Septic arthritis  Last Day of Therapy:  06/17/2019 Labs - Once weekly:  CBC/D and BMP, Labs - Every other week:  ESR and CRP   HYDROcodone-acetaminophen  5-325 MG tablet Commonly known as: Norco Take 1 tablet by mouth every 6 (six) hours as needed for moderate pain. Post op pain What changed:   reasons to take this  additional instructions   ibuprofen 200 MG tablet Commonly known as: ADVIL Take 200 mg by mouth every 4 (four) hours as needed for moderate pain. What changed: Another medication with the same name was removed. Continue taking this medication, and follow the directions you see here.   MAGNESIUM PO Take 1 tablet by mouth every 3 (three) days.   multivitamin with minerals Tabs tablet Take 1 tablet by mouth daily.   vancomycin  IVPB Inject 1,000 mg into the vein every 12 (twelve) hours for 21 days.  Indication:  Septic arthritis  Last Day of Therapy:  06/17/2019 Labs - Sunday/Monday:  CBC/D, BMP, and vancomycin trough. Labs - Thursday:  BMP and vancomycin trough Labs - Every other week:  ESR and CRP   VITAMIN C PO Take 1 tablet by mouth daily.   vitamin E 200 UNIT capsule Take 200 Units by mouth daily.   ZINC PO Take 1 tablet by mouth daily.            Home Infusion Instuctions  (From admission, onward)         Start     Ordered   05/28/19 0000  Home infusion instructions    Question:  Instructions  Answer:  Flushing of vascular access device: 0.9% NaCl pre/post medication administration and prn patency; Heparin 100 u/ml, 64m for implanted ports and Heparin 10u/ml, 563mfor all other central venous catheters.   05/28/19 1136          Diagnostic Studies: MR ANKLE LEFT W WO CONTRAST  Result Date: 05/20/2019 CLINICAL DATA:  Ankle pain since April 06, 2019. Pain and swelling. EXAM: MRI OF THE LEFT ANKLE WITHOUT AND WITH CONTRAST TECHNIQUE: Multiplanar, multisequence MR imaging of the ankle was performed before and after the administration of intravenous contrast. CONTRAST:  1520mULTIHANCE GADOBENATE DIMEGLUMINE 529 MG/ML IV SOLN COMPARISON:  None. FINDINGS: TENDONS Peroneal: Peroneal longus tendon intact. Peroneal brevis intact. Posteromedial: Posterior tibial tendon intact. Flexor hallucis longus tendon intact. Flexor digitorum longus tendon intact. Anterior: Tibialis anterior tendon intact. Extensor hallucis longus tendon intact Extensor digitorum longus tendon intact. Achilles:  Intact. Plantar Fascia: Intact. LIGAMENTS Lateral: Anterior talofibular ligament intact. Calcaneofibular ligament intact. Posterior talofibular ligament intact. Anterior and posterior tibiofibular ligaments intact. Medial: Deltoid ligament intact. Spring ligament intact. CARTILAGE Ankle Joint: Large ankle joint effusion with severe synovial enhancement. Severe periarticular marrow edema in the distal  fibula, distal tibia, and talus with enhancement on postcontrast imaging. Possible subtle erosion along the anterior margin of the distal tibia. Mild surrounding soft tissue edema. Subtalar Joints/Sinus Tarsi: Normal subtalar joints. No subtalar joint effusion. Normal sinus tarsi. Bones: No other marrow signal abnormality. No acute fracture or dislocation. Minimal osteoarthritis of the talonavicular joint. Soft Tissue: No fluid collection or hematoma. No aggressive osseous lesion. IMPRESSION: IMPRESSION 1. Large ankle joint effusion with severe synovitis, extensive periarticular marrow edema centered around the ankle joint and mild edema in the surrounding soft tissues. Overall findings are most concerning for septic arthritis until proven otherwise. An inflammatory arthropathy can have a similar appearance. Recommend further evaluation with an arthrocentesis. Electronically Signed   By: HetKathreen DevoidOn: 05/20/2019 19:24   US [place] SITE RITE  Result Date: 05/27/2019 If Site Rite image not attached, placement could not be confirmed due to current  cardiac rhythm.   Disposition: Discharge disposition: 01-Home or Self Care       Discharge Instructions    Home infusion instructions   Complete by: As directed    Instructions: Flushing of vascular access device: 0.9% NaCl pre/post medication administration and prn patency; Heparin 100 u/ml, 59m for implanted ports and Heparin 10u/ml, 563mfor all other central venous catheters.      Follow-up Information    XuLeandrew KoyanagiMD Follow up in 1 week(s).   Specialty: Orthopedic Surgery Contact information: [phone]          Signed: MiEduard Roux/18/2021, 3:58 PM

## 2019-05-31 ENCOUNTER — Other Ambulatory Visit (HOSPITAL_COMMUNITY)
Admit: 2019-05-31 | Discharge: 2019-05-31 | Disposition: A | Discharge status: Home / Self Care | Payer: BC Managed Care – PPO | Source: Ambulatory Visit | Attending: Orthopaedic Surgery | Admitting: Orthopaedic Surgery

## 2019-05-31 DIAGNOSIS — M009 Pyogenic arthritis, unspecified: Secondary | ICD-10-CM | POA: Diagnosis not present

## 2019-05-31 DIAGNOSIS — M00872 Arthritis due to other bacteria, left ankle and foot: Secondary | ICD-10-CM | POA: Diagnosis not present

## 2019-05-31 LAB — CBC
HCT: 40.1 % (ref 39.0–52.0)
Hemoglobin: 13.8 g/dL (ref 13.0–17.0)
MCH: 30.6 pg (ref 26.0–34.0)
MCHC: 34.4 g/dL (ref 30.0–36.0)
MCV: 88.9 fL (ref 80.0–100.0)
Platelets: 279 10*3/uL (ref 150–400)
RBC: 4.51 MIL/uL (ref 4.22–5.81)
RDW: 12.3 % (ref 11.5–15.5)
WBC: 4.4 10*3/uL (ref 4.0–10.5)
nRBC: 0 % (ref 0.0–0.2)

## 2019-05-31 LAB — BASIC METABOLIC PANEL
Anion gap: 8 (ref 5–15)
BUN: 11 mg/dL (ref 8–23)
CO2: 28 mmol/L (ref 22–32)
Calcium: 9.6 mg/dL (ref 8.9–10.3)
Chloride: 99 mmol/L (ref 98–111)
Creatinine, Ser: 0.82 mg/dL (ref 0.61–1.24)
GFR calc Af Amer: >60 mL/min (ref >60)
GFR calc non Af Amer: >60 mL/min (ref >60)
Glucose, Bld: 129 mg/dL — ABNORMAL HIGH (ref 70–99)
Potassium: 4.1 mmol/L (ref 3.5–5.1)
Sodium: 135 mmol/L (ref 135–145)

## 2019-05-31 LAB — VANCOMYCIN, RANDOM: Vancomycin Rm: 8

## 2019-06-01 LAB — ANAEROBIC AND AEROBIC CULTURE
AER RESULT:: NO GROWTH
MICRO NUMBER:: 10032998
MICRO NUMBER:: 10032999
SPECIMEN QUALITY:: ADEQUATE
SPECIMEN QUALITY:: ADEQUATE

## 2019-06-01 LAB — AEROBIC/ANAEROBIC CULTURE W GRAM STAIN (SURGICAL/DEEP WOUND)
Culture: NO GROWTH
Gram Stain: NONE SEEN

## 2019-06-01 NOTE — Telephone Encounter (Signed)
   Cultures no growth Final from Orthopedics

## 2019-06-02 ENCOUNTER — Other Ambulatory Visit (HOSPITAL_COMMUNITY)
Admit: 2019-06-02 | Discharge: 2019-06-02 | Disposition: A | Discharge status: Home / Self Care | Payer: BC Managed Care – PPO | Source: Ambulatory Visit | Attending: Infectious Disease | Admitting: Infectious Disease

## 2019-06-02 DIAGNOSIS — M00872 Arthritis due to other bacteria, left ankle and foot: Secondary | ICD-10-CM | POA: Insufficient documentation

## 2019-06-02 LAB — BASIC METABOLIC PANEL
Anion gap: 10 (ref 5–15)
BUN: 11 mg/dL (ref 8–23)
CO2: 27 mmol/L (ref 22–32)
Calcium: 9.5 mg/dL (ref 8.9–10.3)
Chloride: 99 mmol/L (ref 98–111)
Creatinine, Ser: 0.83 mg/dL (ref 0.61–1.24)
GFR calc Af Amer: >60 mL/min (ref >60)
GFR calc non Af Amer: >60 mL/min (ref >60)
Glucose, Bld: 115 mg/dL — ABNORMAL HIGH (ref 70–99)
Potassium: 4.1 mmol/L (ref 3.5–5.1)
Sodium: 136 mmol/L (ref 135–145)

## 2019-06-02 LAB — VANCOMYCIN, TROUGH: Vancomycin Tr: 9 ug/mL — ABNORMAL LOW (ref 15–20)

## 2019-06-03 ENCOUNTER — Other Ambulatory Visit: Payer: Self-pay

## 2019-06-03 ENCOUNTER — Encounter: Payer: Self-pay | Admitting: Orthopaedic Surgery

## 2019-06-03 ENCOUNTER — Ambulatory Visit (INDEPENDENT_AMBULATORY_CARE_PROVIDER_SITE_OTHER): Payer: BC Managed Care – PPO | Admitting: Physician Assistant

## 2019-06-03 VITALS — Ht 67.5 in | Wt 165.0 lb

## 2019-06-03 DIAGNOSIS — M00072 Staphylococcal arthritis, left ankle and foot: Secondary | ICD-10-CM

## 2019-06-03 DIAGNOSIS — M00872 Arthritis due to other bacteria, left ankle and foot: Secondary | ICD-10-CM | POA: Diagnosis not present

## 2019-06-03 NOTE — Progress Notes (Signed)
   Post-Op Visit Note   Patient: George Aguilar           Date of Birth: 12/11/1955           MRN: [id number] Visit Date: 06/03/2019 PCP: Sandre Kitty, MD   Assessment & Plan:  Chief Complaint:  Chief Complaint  Patient presents with  . Left Ankle - Follow-up    Left ankle washout DOS 05/26/2019   Visit Diagnoses:  1. Staphylococcal arthritis of left ankle (HCC)     Plan: Patient is a pleasant 64 year old gentleman who comes in today 1 week out left ankle irrigation and debridement for possible infection.  Intraoperative cultures are negative to date.  He does have a PICC line and is on vancomycin and ceftriaxone.  He is being followed by infectious disease.  He is scheduled to follow-up with them on 06/14/2018 with anticipation of ending antibiotics on 06/16/2018.  He is currently doing well.  No fevers or chills.  Minimal pain.  He does have slight drainage to the medial portal where the Hemovac drain was placed.  This is nonpurulent.  Examination of his left ankle reveals well-healing surgical portals without evidence of infection or cellulitis.  He is neurovascular intact distally.  At this point, he will continue weightbearing as tolerated in his cam walker.  He will continue antibiotics per ID.  He will follow-up with [place] in 2 weeks after he has seen infectious disease and finished his antibiotics.  He will call with concerns or questions in the meantime.  Follow-Up Instructions: Return in about 2 weeks (around 06/17/2019).   Orders:  No orders of the defined types were placed in this encounter.  No orders of the defined types were placed in this encounter.   Imaging: No new imaging  PMFS History: Patient Active Problem List   Diagnosis Date Noted  . Chronic leukopenia 05/27/2019  . Septic arthritis of left ankle (HCC) 04/14/2019  . Benign paroxysmal positional vertigo 07/09/2018  . Skin nodule 04/30/2016  . Pain in joint of right shoulder 04/19/2015  . Erectile  dysfunction 04/26/2014  . Annual physical exam 10/31/2011  . HYPERCHOLESTEROLEMIA 07/09/2006  . MIGRAINE, UNSPEC., W/O INTRACTABLE MIGRAINE 07/09/2006  . Lumbago 07/09/2006   History reviewed. No pertinent past medical history.  Family History  Problem Relation Age of Onset  . Diabetes Mother   . Ovarian cancer Mother   . Diabetes Sister   . Diabetes Brother     Past Surgical History:  Procedure Laterality Date  . COLONOSCOPY    . I & D EXTREMITY Left 05/26/2019   Procedure: ARTHOSCOPIC WASHOUT LEFT ANKLE;  Surgeon: Tarry Kos, MD;  Location: [facility] OR;  Service: Orthopedics;  Laterality: Left;   Social History   Occupational History  . Occupation: maintenance   Tobacco Use  . Smoking status: Never Smoker  . Smokeless tobacco: Never Used  Substance and Sexual Activity  . Alcohol use: Not Currently  . Drug use: No  . Sexual activity: Yes    Birth control/protection: Condom    Comment: Women

## 2019-06-06 ENCOUNTER — Other Ambulatory Visit (HOSPITAL_COMMUNITY)
Admission: RE | Admit: 2019-06-06 | Discharge: 2019-06-06 | Disposition: A | Discharge status: Home / Self Care | Payer: Self-pay | Source: Other Acute Inpatient Hospital | Attending: Orthopaedic Surgery | Admitting: Orthopaedic Surgery

## 2019-06-06 DIAGNOSIS — M01X0 Direct infection of unspecified joint in infectious and parasitic diseases classified elsewhere: Secondary | ICD-10-CM | POA: Insufficient documentation

## 2019-06-06 DIAGNOSIS — M00872 Arthritis due to other bacteria, left ankle and foot: Secondary | ICD-10-CM | POA: Diagnosis not present

## 2019-06-06 LAB — BASIC METABOLIC PANEL
Anion gap: 10 (ref 5–15)
BUN: 8 mg/dL (ref 8–23)
CO2: 26 mmol/L (ref 22–32)
Calcium: 9.6 mg/dL (ref 8.9–10.3)
Chloride: 99 mmol/L (ref 98–111)
Creatinine, Ser: 0.97 mg/dL (ref 0.61–1.24)
GFR calc Af Amer: >60 mL/min (ref >60)
GFR calc non Af Amer: >60 mL/min (ref >60)
Glucose, Bld: 109 mg/dL — ABNORMAL HIGH (ref 70–99)
Potassium: 4 mmol/L (ref 3.5–5.1)
Sodium: 135 mmol/L (ref 135–145)

## 2019-06-06 LAB — CBC
HCT: 39.5 % (ref 39.0–52.0)
Hemoglobin: 13.6 g/dL (ref 13.0–17.0)
MCH: 30.7 pg (ref 26.0–34.0)
MCHC: 34.4 g/dL (ref 30.0–36.0)
MCV: 89.2 fL (ref 80.0–100.0)
Platelets: 227 10*3/uL (ref 150–400)
RBC: 4.43 MIL/uL (ref 4.22–5.81)
RDW: 12.7 % (ref 11.5–15.5)
WBC: 4.2 10*3/uL (ref 4.0–10.5)
nRBC: 0 % (ref 0.0–0.2)

## 2019-06-09 DIAGNOSIS — M00872 Arthritis due to other bacteria, left ankle and foot: Secondary | ICD-10-CM | POA: Diagnosis not present

## 2019-06-09 DIAGNOSIS — M01X0 Direct infection of unspecified joint in infectious and parasitic diseases classified elsewhere: Secondary | ICD-10-CM | POA: Diagnosis not present

## 2019-06-11 DIAGNOSIS — M00872 Arthritis due to other bacteria, left ankle and foot: Secondary | ICD-10-CM | POA: Diagnosis not present

## 2019-06-13 DIAGNOSIS — M01X0 Direct infection of unspecified joint in infectious and parasitic diseases classified elsewhere: Secondary | ICD-10-CM | POA: Diagnosis not present

## 2019-06-13 DIAGNOSIS — M00872 Arthritis due to other bacteria, left ankle and foot: Secondary | ICD-10-CM | POA: Diagnosis not present

## 2019-06-14 ENCOUNTER — Telehealth: Payer: Self-pay

## 2019-06-14 NOTE — Telephone Encounter (Signed)
Received call from [facility] Larita Fife stating patient has been on/off again vomiting with diarrhea since Sat 1/30. Patient currently taking Vancomycin and Ceftriaxone with end date on 2/5. Patient is scheduled to see Dr. Orvan Falconer tomorrow but would like to know if he can go ahead and stop antibiotics. Routing to provider for advise. Valarie Cones

## 2019-06-14 NOTE — Telephone Encounter (Signed)
Yes, he can stop the antibiotics now.

## 2019-06-14 NOTE — Telephone Encounter (Signed)
Returned call to [place] with [facility] and gave verbal orders per Dr. Orvan Falconer to stop antibiotics now. [facility] will call the patient and make him aware.  Valarie Cones

## 2019-06-15 ENCOUNTER — Encounter: Payer: Self-pay | Admitting: Internal Medicine

## 2019-06-15 ENCOUNTER — Other Ambulatory Visit: Payer: Self-pay

## 2019-06-15 ENCOUNTER — Ambulatory Visit (INDEPENDENT_AMBULATORY_CARE_PROVIDER_SITE_OTHER): Payer: BC Managed Care – PPO | Admitting: Internal Medicine

## 2019-06-15 DIAGNOSIS — M009 Pyogenic arthritis, unspecified: Secondary | ICD-10-CM

## 2019-06-15 DIAGNOSIS — R197 Diarrhea, unspecified: Secondary | ICD-10-CM | POA: Insufficient documentation

## 2019-06-15 NOTE — Assessment & Plan Note (Signed)
I am concerned that he may have antibiotic associated, C. difficile infection.  I will stop his antibiotics now.  I asked him to stop taking Imodium which can make toxin induced diarrhea much worse.  I asked him to bring in a stool specimen for PCR testing.  I will call him with results.

## 2019-06-15 NOTE — Progress Notes (Signed)
Per verbal order from Dr Orvan Falconer, 40 cm Single Lumen Peripherally Inserted Central Catheter removed from right basilic, tip intact. No sutures present. RN confirmed length per chart. Dressing was clean and dry. Petroleum dressing applied. Pt advised no heavy lifting with this arm, leave dressing for 24 hours and call the office or seek emergent care if dressing becomes soaked with blood or swelling or sharp pain presents. Patient verbalized understanding and agreement.  Patient's questions answered to their satisfaction. Patient tolerated procedure well, RN walked patient to check out.  Notified home health and Cassie Kuppelweiser. Andree Coss, RN

## 2019-06-15 NOTE — Progress Notes (Signed)
[facility]ious [facility]  Patient Active Problem List   Diagnosis Date Noted  . Diarrhea 06/15/2019    Priority: High  . Septic arthritis of left ankle ([place]) 04/14/2019    Priority: High  . Chronic leukopenia 05/27/2019  . Benign paroxysmal positional vertigo 07/09/2018  . Skin nodule 04/30/2016  . Pain in joint of right shoulder 04/19/2015  . Erectile dysfunction 04/26/2014  . Annual physical exam 10/31/2011  . HYPERCHOLESTEROLEMIA 07/09/2006  . MIGRAINE, UNSPEC., W/O INTRACTABLE MIGRAINE 07/09/2006  . Lumbago 07/09/2006    Patient's Medications  New Prescriptions   No medications on file  Previous Medications   ASCORBIC ACID (VITAMIN C PO)    Take 1 tablet by mouth daily.   B COMPLEX VITAMINS TABLET    Take 1 tablet by mouth daily.   HYDROCODONE-ACETAMINOPHEN (NORCO) 5-325 MG TABLET    Take 1 tablet by mouth every 6 (six) hours as needed for moderate pain. Post op pain   IBUPROFEN (ADVIL) 200 MG TABLET    Take 200 mg by mouth every 4 (four) hours as needed for moderate pain.   MAGNESIUM PO    Take 1 tablet by mouth every 3 (three) days.   MULTIPLE VITAMIN (MULTIVITAMIN WITH MINERALS) TABS TABLET    Take 1 tablet by mouth daily.   MULTIPLE VITAMINS-MINERALS (ZINC PO)    Take 1 tablet by mouth daily.   VITAMIN E 200 UNIT CAPSULE    Take 200 Units by mouth daily.  Modified Medications   No medications on file  Discontinued Medications   CEFTRIAXONE (ROCEPHIN) IVPB    Inject 2 g into the vein daily for 21 days. Indication:  Septic arthritis  Last Day of Therapy:  06/17/2019 Labs - Once weekly:  CBC/D and BMP, Labs - Every other week:  ESR and CRP   VANCOMYCIN IVPB    Inject 1,000 mg into the vein every 12 (twelve) hours for 21 days. Indication:  Septic arthritis  Last Day of Therapy:  06/17/2019 Labs - Sunday/Monday:  CBC/D, BMP, and vancomycin trough. Labs - Thursday:  BMP and vancomycin trough Labs - Every other week:  ESR and CRP     Subjective: George Aguilar is in for his routine hospital follow-up visit.  He sprained his ankle when stepping out of his bathtub the week before Thanksgiving.  Initially, he did not think much of it but just before Thanksgiving he began to have increasing pain and swelling.  He was seen in the emergency department where a plain x-ray was negative.  He was given crutches and told to follow-up with orthopedics if he was not better in 1 week.  He had persistent pain and swelling and followed up with Dr. Erlinda Hong.  He had gradual worsening and was scheduled for an MRI that was finally done in early January.  It showed:  IMPRESSION 1. Large ankle joint effusion with severe synovitis, extensive periarticular marrow edema centered around the ankle joint and mild edema in the surrounding soft tissues. Overall findings are most concerning for septic arthritis until proven otherwise. An inflammatory arthropathy can have a similar appearance. Recommend further evaluation with an arthrocentesis.  He underwent outpatient arthrocentesis on 05/24/2019.  The fluid was indicative of infection but Dr. Erlinda Hong told me that cultures were negative.  He was admitted 2 days later and underwent arthroscopic washout.  Synovial fluid analyses showed 69,650 white blood cells of which 92% were segmented neutrophils.  No crystals were seen.  Synovial fluid Gram stain did not show any organisms and cultures were negative again.  He had not been on any antibiotics recently.  He works for [employer] and enjoys riding his bike.  He was discharged on IV vancomycin and ceftriaxone and had prompt improvement.  He has not needed any pain medication since discharge.  His sutures have been removed.  His vancomycin levels were low and his dose was increased on 06/11/2019.  Shortly after that he began having abdominal cramps, nausea, vomiting and watery diarrhea.  He assumed it was due to the increased dose of vancomycin and asked to stop his  antibiotics yesterday.  He is feeling only a little bit better so far.  He has taken several doses of Imodium.  He has not had any fever.  Review of Systems: Review of Systems  Constitutional: Negative for chills, diaphoresis and fever.  HENT: Negative for congestion and sore throat.   Respiratory: Negative for cough and shortness of breath.   Cardiovascular: Negative for chest pain.  Gastrointestinal: Positive for abdominal pain, diarrhea, nausea and vomiting.  Musculoskeletal: Negative for joint pain.    No past medical history on file.  Social History   Tobacco Use  . Smoking status: Never Smoker  . Smokeless tobacco: Never Used  Substance Use Topics  . Alcohol use: Not Currently  . Drug use: No    Family History  Problem Relation Age of Onset  . Diabetes Mother   . Ovarian cancer Mother   . Diabetes Sister   . Diabetes Brother     No Known Allergies  Objective: Vitals:   06/15/19 1149  BP: 119/73  Pulse: 86  Temp: 98.5 F (36.9 C)  TempSrc: Oral  SpO2: 100%  Weight: 165 lb (74.8 kg)   Body mass index is 25.46 kg/m.  Physical Exam Constitutional:      Comments: He is very pleasant and in no distress.  Cardiovascular:     Rate and Rhythm: Normal rate and regular rhythm.     Heart sounds: No murmur.  Pulmonary:     Effort: Pulmonary effort is normal.     Breath sounds: Normal breath sounds.  Abdominal:     Palpations: Abdomen is soft.     Tenderness: There is no abdominal tenderness.  Musculoskeletal:        General: Swelling present. No tenderness.     Comments: He still has some swelling of his left medial ankle but overall his foot is looking much better.  His incisions are healing nicely.  He has no unusual warmth or redness.  He has no pain with range of motion.  Skin:    Findings: No rash.  Psychiatric:        Mood and Affect: Mood normal.         Lab Results    Problem List Items Addressed This Visit      High   Septic arthritis  of left ankle ([place])    I am very hopeful that his culture-negative septic arthritis is now cured through a combination of surgical washout and 3 weeks of empiric antibiotics.  I will stop his antibiotics and have his PICC removed.  He will follow-up here in 2 weeks.      Relevant Orders   CBC   Basic metabolic panel   C-reactive protein   Sedimentation rate   Diarrhea    I am concerned that he may have antibiotic associated, C. difficile infection.  I will  stop his antibiotics now.  I asked him to stop taking Imodium which can make toxin induced diarrhea much worse.  I asked him to bring in a stool specimen for PCR testing.  I will call him with results.      Relevant Orders   Clostridium difficile Toxin B, Qualitative, Real-Time PCR       Michel Bickers, MD [facility] [place] [facility] [phone] pager   [phone] cell 06/15/2019, 12:19 PM

## 2019-06-15 NOTE — Assessment & Plan Note (Signed)
I am very hopeful that his culture-negative septic arthritis is now cured through a combination of surgical washout and 3 weeks of empiric antibiotics.  I will stop his antibiotics and have his PICC removed.  He will follow-up here in 2 weeks.

## 2019-06-16 ENCOUNTER — Other Ambulatory Visit: Payer: Self-pay

## 2019-06-16 ENCOUNTER — Other Ambulatory Visit: Payer: BC Managed Care – PPO

## 2019-06-16 DIAGNOSIS — R197 Diarrhea, unspecified: Secondary | ICD-10-CM

## 2019-06-16 LAB — BASIC METABOLIC PANEL
BUN/Creatinine Ratio: 5 (calc) — ABNORMAL LOW (ref 6–22)
BUN: 5 mg/dL — ABNORMAL LOW (ref 7–25)
CO2: 27 mmol/L (ref 20–32)
Calcium: 9.8 mg/dL (ref 8.6–10.3)
Chloride: 100 mmol/L (ref 98–110)
Creat: 0.91 mg/dL (ref 0.70–1.25)
Glucose, Bld: 92 mg/dL (ref 65–99)
Potassium: 4.1 mmol/L (ref 3.5–5.3)
Sodium: 135 mmol/L (ref 135–146)

## 2019-06-16 LAB — CBC
HCT: 41.3 % (ref 38.5–50.0)
Hemoglobin: 14.6 g/dL (ref 13.2–17.1)
MCH: 30.9 pg (ref 27.0–33.0)
MCHC: 35.4 g/dL (ref 32.0–36.0)
MCV: 87.5 fL (ref 80.0–100.0)
MPV: 9.6 fL (ref 7.5–12.5)
Platelets: 234 10*3/uL (ref 140–400)
RBC: 4.72 10*6/uL (ref 4.20–5.80)
RDW: 13.3 % (ref 11.0–15.0)
WBC: 3 10*3/uL — ABNORMAL LOW (ref 3.8–10.8)

## 2019-06-16 LAB — SEDIMENTATION RATE: Sed Rate: 31 mm/h — ABNORMAL HIGH (ref 0–20)

## 2019-06-16 LAB — C-REACTIVE PROTEIN: CRP: 36.7 mg/L — ABNORMAL HIGH (ref <8.0)

## 2019-06-17 ENCOUNTER — Ambulatory Visit: Payer: BC Managed Care – PPO | Admitting: Physician Assistant

## 2019-06-17 LAB — CLOSTRIDIUM DIFFICILE TOXIN B, QUALITATIVE, REAL-TIME PCR: Toxigenic C. Difficile by PCR: NOT DETECTED

## 2019-06-20 ENCOUNTER — Telehealth: Payer: Self-pay | Admitting: Internal Medicine

## 2019-06-20 NOTE — Telephone Encounter (Signed)
I called and spoke with George Aguilar today.  He is feeling much better.  His diarrhea, nausea and vomiting resolved promptly last week.  His C. difficile PCR is negative.  His sed rate and C-reactive protein remain elevated but he says that his ankle is feeling better and the swelling is going down.  He will stay off antibiotics and follow-up with me on 06/30/2019.

## 2019-06-23 ENCOUNTER — Ambulatory Visit (INDEPENDENT_AMBULATORY_CARE_PROVIDER_SITE_OTHER): Payer: BC Managed Care – PPO | Admitting: Orthopaedic Surgery

## 2019-06-23 ENCOUNTER — Encounter: Payer: Self-pay | Admitting: Orthopaedic Surgery

## 2019-06-23 ENCOUNTER — Other Ambulatory Visit: Payer: Self-pay

## 2019-06-23 DIAGNOSIS — M00072 Staphylococcal arthritis, left ankle and foot: Secondary | ICD-10-CM | POA: Diagnosis not present

## 2019-06-23 NOTE — Progress Notes (Signed)
   Post-Op Visit Note   Patient: George Aguilar           Date of Birth: February 02, 1956           MRN: [id number] Visit Date: 06/23/2019 PCP: Sandre Kitty, MD   Assessment & Plan:  Chief Complaint:  Chief Complaint  Patient presents with  . Left Ankle - Pain   Visit Diagnoses:  1. Staphylococcal arthritis of left ankle (HCC)     Plan: [name] is status post left ankle septic arthritis washout.  He has completed his course of antibiotics.  His cultures were negative.  He states that he is feeling much better at this time.  He still has moderate swelling.  He still is out of work.  His portal sites are well-healed without any signs of infection.  His range of motion is mildly restricted secondary to the swelling. No signs of infection.  At this point he may increase activity as tolerated.  Would like to obtain arthritis panel to rule out autoimmune disorder.  We will keep him out of work for another 4 weeks so he can recover some more.  With we will see him back as needed.  Follow-Up Instructions: No follow-ups on file.   Orders:  No orders of the defined types were placed in this encounter.  No orders of the defined types were placed in this encounter.   Imaging: No results found.  PMFS History: Patient Active Problem List   Diagnosis Date Noted  . Diarrhea 06/15/2019  . Chronic leukopenia 05/27/2019  . Septic arthritis of left ankle (HCC) 04/14/2019  . Benign paroxysmal positional vertigo 07/09/2018  . Skin nodule 04/30/2016  . Pain in joint of right shoulder 04/19/2015  . Erectile dysfunction 04/26/2014  . Annual physical exam 10/31/2011  . HYPERCHOLESTEROLEMIA 07/09/2006  . MIGRAINE, UNSPEC., W/O INTRACTABLE MIGRAINE 07/09/2006  . Lumbago 07/09/2006   History reviewed. No pertinent past medical history.  Family History  Problem Relation Age of Onset  . Diabetes Mother   . Ovarian cancer Mother   . Diabetes Sister   . Diabetes Brother     Past Surgical  History:  Procedure Laterality Date  . COLONOSCOPY    . I & D EXTREMITY Left 05/26/2019   Procedure: ARTHOSCOPIC WASHOUT LEFT ANKLE;  Surgeon: Tarry Kos, MD;  Location: [facility] OR;  Service: Orthopedics;  Laterality: Left;   Social History   Occupational History  . Occupation: maintenance   Tobacco Use  . Smoking status: Never Smoker  . Smokeless tobacco: Never Used  Substance and Sexual Activity  . Alcohol use: Not Currently  . Drug use: No  . Sexual activity: Yes    Birth control/protection: Condom    Comment: Women

## 2019-06-25 LAB — RHEUMATOID FACTOR: Rheumatoid fact SerPl-aCnc: <14 IU/mL (ref <14)

## 2019-06-25 LAB — URIC ACID: Uric Acid, Serum: 4.9 mg/dL (ref 4.0–8.0)

## 2019-06-25 LAB — SED RATE MANUAL WEST RFLX: SED RATE BY MODIFIED WESTERGREN,MANUAL: 12 mm/h (ref 0–20)

## 2019-06-25 LAB — SEDIMENTATION RATE

## 2019-06-25 LAB — ANA: Anti Nuclear Antibody (ANA): NEGATIVE

## 2019-06-30 ENCOUNTER — Ambulatory Visit (INDEPENDENT_AMBULATORY_CARE_PROVIDER_SITE_OTHER): Payer: BC Managed Care – PPO | Admitting: Internal Medicine

## 2019-06-30 DIAGNOSIS — M009 Pyogenic arthritis, unspecified: Secondary | ICD-10-CM

## 2019-06-30 NOTE — Progress Notes (Signed)
Virtual Visit via Telephone Note  I connected with George Aguilar on 06/30/19 at  9:45 AM EST by telephone and verified that I am speaking with the correct person using two identifiers.  Location: Patient: Home Provider: [facility]   I discussed the limitations, risks, security and privacy concerns of performing an evaluation and management service by telephone and the availability of in person appointments. I also discussed with the patient that there may be a patient responsible charge related to this service. The patient expressed understanding and agreed to proceed.   History of Present Illness: I called and spoke with George Aguilar this morning.  He completed 3 weeks of IV vancomycin and ceftriaxone for culture-negative left ankle septic arthritis 2 weeks ago.  He is not having any more diarrhea.  He is feeling better and the swelling and discomfort in his left ankle is slowly improving.  He notes that he does not have any swelling in his ankle when he first gets up in the morning but it does increase during the day.   Observations/Objective: Blood work 06/23/2019 Sed rate 12 Uric acid 4.9 ANA negative Rheumatoid factor less than 14  Assessment and Plan: I believe that George Aguilar is septic arthritis has been cured.  I expect that he will continue to improve slowly.  All of his recent blood work was negative for autoimmune arthritis and risk for gout.  Follow Up Instructions: Follow-up here as necessary   I discussed the assessment and treatment plan with the patient. The patient was provided an opportunity to ask questions and all were answered. The patient agreed with the plan and demonstrated an understanding of the instructions.   The patient was advised to call back or seek an in-person evaluation if the symptoms worsen or if the condition fails to improve as anticipated.  I provided 15 minutes of non-face-to-face time during this encounter.   Cliffton Asters, MD

## 2019-07-26 ENCOUNTER — Other Ambulatory Visit: Payer: Self-pay

## 2019-07-26 ENCOUNTER — Encounter: Payer: Self-pay | Admitting: Orthopaedic Surgery

## 2019-07-26 ENCOUNTER — Ambulatory Visit (INDEPENDENT_AMBULATORY_CARE_PROVIDER_SITE_OTHER): Payer: BC Managed Care – PPO | Admitting: Orthopaedic Surgery

## 2019-07-26 VITALS — Ht 67.5 in | Wt 165.0 lb

## 2019-07-26 DIAGNOSIS — M009 Pyogenic arthritis, unspecified: Secondary | ICD-10-CM

## 2019-07-26 NOTE — Progress Notes (Signed)
   Post-Op Visit Note   Patient: George Aguilar           Date of Birth: [birth year]           MRN: [id number] Visit Date: 07/26/2019 PCP: Sandre Kitty, MD   Assessment & Plan:  Chief Complaint:  Chief Complaint  Patient presents with  . Left Ankle - Pain   Visit Diagnoses:  1. Septic arthritis of left ankle, due to unspecified organism [facility])     Plan: Danton returns today for postop visit status post arthroscopic left ankle washout.  His cultures never grew anything.  He went through a full course of IV antibiotics as directed by the infectious disease team.  He returns today for follow-up.  He has been doing well reports no pain.  He does have some stiffness that is related to swelling that is worse at the end of the day.  No constitutional symptoms.  No recurrence of symptoms.  His surgical scars are fully healed.  He has good range of motion of his ankle and subtalar joint without pain.  He has mild swelling.  At this point he has recovered well and he is released to activity as tolerated.  Work note was provided today.  Follow-up as needed.  Follow-Up Instructions: Return if symptoms worsen or fail to improve.   Orders:  No orders of the defined types were placed in this encounter.  No orders of the defined types were placed in this encounter.   Imaging: No results found.  PMFS History: Patient Active Problem List   Diagnosis Date Noted  . Diarrhea 06/15/2019  . Chronic leukopenia 05/27/2019  . Septic arthritis of left ankle (HCC) 04/14/2019  . Benign paroxysmal positional vertigo 07/09/2018  . Skin nodule 04/30/2016  . Pain in joint of right shoulder 04/19/2015  . Erectile dysfunction 04/26/2014  . Annual physical exam 10/31/2011  . HYPERCHOLESTEROLEMIA 07/09/2006  . MIGRAINE, UNSPEC., W/O INTRACTABLE MIGRAINE 07/09/2006  . Lumbago 07/09/2006   History reviewed. No pertinent past medical history.  Family History  Problem Relation Age of Onset  . Diabetes  Mother   . Ovarian cancer Mother   . Diabetes Sister   . Diabetes Brother     Past Surgical History:  Procedure Laterality Date  . COLONOSCOPY    . I & D EXTREMITY Left 05/26/2019   Procedure: ARTHOSCOPIC WASHOUT LEFT ANKLE;  Surgeon: Tarry Kos, MD;  Location[facility] OR;  Service: Orthopedics;  Laterality: Left;   Social History   Occupational History  . Occupation: maintenance   Tobacco Use  . Smoking status: Never Smoker  . Smokeless tobacco: Never Used  Substance and Sexual Activity  . Alcohol use: Not Currently  . Drug use: No  . Sexual activity: Yes    Birth control/protection: Condom    Comment: Women

## 2020-04-19 ENCOUNTER — Other Ambulatory Visit: Payer: Self-pay

## 2020-04-19 ENCOUNTER — Encounter: Payer: BC Managed Care – PPO | Admitting: Family Medicine

## 2020-04-19 ENCOUNTER — Ambulatory Visit (INDEPENDENT_AMBULATORY_CARE_PROVIDER_SITE_OTHER): Payer: BC Managed Care – PPO | Admitting: Family Medicine

## 2020-04-19 ENCOUNTER — Encounter: Payer: Self-pay | Admitting: Family Medicine

## 2020-04-19 VITALS — BP 120/62 | HR 68 | Ht 67.5 in | Wt 186.0 lb

## 2020-04-19 DIAGNOSIS — Z0000 Encounter for general adult medical examination without abnormal findings: Secondary | ICD-10-CM | POA: Diagnosis not present

## 2020-04-19 DIAGNOSIS — R7309 Other abnormal glucose: Secondary | ICD-10-CM | POA: Diagnosis not present

## 2020-04-19 DIAGNOSIS — Z125 Encounter for screening for malignant neoplasm of prostate: Secondary | ICD-10-CM

## 2020-04-19 DIAGNOSIS — E7800 Pure hypercholesterolemia, unspecified: Secondary | ICD-10-CM | POA: Diagnosis not present

## 2020-04-19 DIAGNOSIS — M25561 Pain in right knee: Secondary | ICD-10-CM

## 2020-04-19 LAB — POCT GLYCOSYLATED HEMOGLOBIN (HGB A1C): Hemoglobin A1C: 5.5 % (ref 4.0–5.6)

## 2020-04-19 NOTE — Progress Notes (Signed)
    SUBJECTIVE:   CHIEF COMPLAINT / HPI:   Patient is here today for his yearly checkup.  His left foot feels much better since the last time I saw him.  After he started riding his bicycle again, the pain and stiffness in his ankle resolved.  He went back to work in March and started riding his bike again in July.  Patient complains of a cramping in his right knee when he stands.  This started last week.  It goes away after he walks it off.  Sitting also makes it feel better.  It does not bother him that much at this time.  It does not interfere with his work or exercise routine.  He describes it as a muscle cramping but points to the knee when asked where it hurts  Patient would like his PSA checked.  He has no family history of prostate disease, and does not have any current urinary issues.   PERTINENT  PMH / PSH: Septic arthritis  OBJECTIVE:   BP 120/62   Pulse 68   Ht 5' 7.5" (1.715 m)   Wt 186 lb (84.4 kg)   SpO2 99%   BMI 28.70 kg/m   General: Alert, oriented.  No acute distress.  Healthy male, appears younger than stated age. HEENT: PERRLA, EOMI CV: Regular rate and rhythm, no murmurs Pulmonary: Lungs clear auscultation bilaterally GI: Soft, nontender to palpation normal bowel sounds MSK: No swelling of the knees bilaterally.  Right knee, thigh, calf nontender to palpation. Psych: Pleasant affect.  Spontaneous speech.  Good eye contact  ASSESSMENT/PLAN:   Annual physical exam Patient doing well.  Back to riding his bike again after extended ankle issue. -PSA -Lipid panel -A1c  Acute pain of right knee Less than 1 week duration and mild describes a muscle cramping type pain, but located in the area of the knee between the quadriceps/hamstrings and calf muscles distally.  May still be muscular at the proximal insertion of the gastrocnemius or distal biceps femoris.  No crepitus appreciated or other signs of arthritis.  Will watch for now and advised patient to return to  clinic if this worsens or becomes more bothersome.  HYPERCHOLESTEROLEMIA Mildly elevated on previous checks.  Patient wishes to recheck today.  States he has a family history of high cholesterol.  Currently diet controlled.  Patient fasting today -Fasting lipid panel     Sandre Kitty, MD [facility]

## 2020-04-19 NOTE — Patient Instructions (Signed)
It was nice to see you again today,  I will get the lab tests we discussed and call you with the results.  I do not see anything concerning on your knee exam.  It is most likely mild muscle strain.  You can help improve this by doing stretching exercises including calf stretching, hamstring, and quadriceps as well as IT band and piriformis stretching that we discussed in your visit.  Have a great day,  Frederic Jericho, MD

## 2020-04-20 DIAGNOSIS — M25561 Pain in right knee: Secondary | ICD-10-CM | POA: Insufficient documentation

## 2020-04-20 HISTORY — DX: Pain in right knee: M25.561

## 2020-04-20 LAB — LIPID PANEL
Chol/HDL Ratio: 4.3 ratio (ref 0.0–5.0)
Cholesterol, Total: 248 mg/dL — ABNORMAL HIGH (ref 100–199)
HDL: 58 mg/dL (ref >39)
LDL Chol Calc (NIH): 173 mg/dL — ABNORMAL HIGH (ref 0–99)
Triglycerides: 99 mg/dL (ref 0–149)
VLDL Cholesterol Cal: 17 mg/dL (ref 5–40)

## 2020-04-20 LAB — PSA: Prostate Specific Ag, Serum: 0.6 ng/mL (ref 0.0–4.0)

## 2020-04-20 NOTE — Assessment & Plan Note (Signed)
Less than 1 week duration and mild describes a muscle cramping type pain, but located in the area of the knee between the quadriceps/hamstrings and calf muscles distally.  May still be muscular at the proximal insertion of the gastrocnemius or distal biceps femoris.  No crepitus appreciated or other signs of arthritis.  Will watch for now and advised patient to return to clinic if this worsens or becomes more bothersome.

## 2020-04-20 NOTE — Assessment & Plan Note (Signed)
Patient doing well.  Back to riding his bike again after extended ankle issue. -PSA -Lipid panel -A1c

## 2020-04-20 NOTE — Assessment & Plan Note (Addendum)
Mildly elevated on previous checks.  Patient wishes to recheck today.  States he has a family history of high cholesterol.  Currently diet controlled.  Patient fasting today -Fasting lipid panel

## 2020-04-25 ENCOUNTER — Telehealth: Payer: Self-pay

## 2020-04-25 NOTE — Telephone Encounter (Signed)
Patient George Aguilar on nurse line returning call to provider to discuss results.   Patient would like returned phone call to discuss results in more detail with Dr. Constance Goltz at [phone]  Veronda Prude, RN

## 2020-04-29 ENCOUNTER — Other Ambulatory Visit: Payer: Self-pay | Admitting: Family Medicine

## 2020-04-29 MED ORDER — TADALAFIL 5 MG PO TABS: 2.5000 mg | ORAL_TABLET | Freq: Every day | ORAL | 7 refills | Status: DC | PRN

## 2020-04-29 NOTE — Telephone Encounter (Signed)
Discussed results with patient.  Pt states his diet worsened recently and he was eating more fried foods than usual.  Pt desires to try to control with diet and exercise.  Will recheck at next visit at 4-5 months.  If still elevated will discuss starting statin.

## 2020-07-10 ENCOUNTER — Telehealth: Payer: Self-pay | Admitting: Family Medicine

## 2020-07-10 NOTE — Telephone Encounter (Signed)
Patient is calling and would like for someone to call him to discuss the results from the labs he had done in December.   The best call back number is [phone].

## 2020-07-11 ENCOUNTER — Other Ambulatory Visit: Payer: Self-pay | Admitting: Family Medicine

## 2020-07-11 NOTE — Telephone Encounter (Signed)
Letter created and sent to admin to mail to patient.

## 2020-08-21 DIAGNOSIS — Z1211 Encounter for screening for malignant neoplasm of colon: Secondary | ICD-10-CM | POA: Diagnosis not present

## 2020-09-19 DIAGNOSIS — H6121 Impacted cerumen, right ear: Secondary | ICD-10-CM | POA: Diagnosis not present

## 2020-10-22 DIAGNOSIS — D125 Benign neoplasm of sigmoid colon: Secondary | ICD-10-CM | POA: Diagnosis not present

## 2020-10-22 DIAGNOSIS — K635 Polyp of colon: Secondary | ICD-10-CM | POA: Diagnosis not present

## 2020-10-22 DIAGNOSIS — Z1211 Encounter for screening for malignant neoplasm of colon: Secondary | ICD-10-CM | POA: Diagnosis not present

## 2020-10-25 DIAGNOSIS — Z20822 Contact with and (suspected) exposure to covid-19: Secondary | ICD-10-CM | POA: Diagnosis not present

## 2020-10-27 DIAGNOSIS — Z20828 Contact with and (suspected) exposure to other viral communicable diseases: Secondary | ICD-10-CM | POA: Diagnosis not present

## 2020-12-11 DIAGNOSIS — Z7251 High risk heterosexual behavior: Secondary | ICD-10-CM | POA: Diagnosis not present

## 2020-12-11 DIAGNOSIS — R209 Unspecified disturbances of skin sensation: Secondary | ICD-10-CM | POA: Diagnosis not present

## 2021-05-11 NOTE — Progress Notes (Signed)
° ° °  SUBJECTIVE:   Chief compliant/HPI: annual examination  BRAYDON KULLMAN is a 65 y.o. who presents today for an annual exam. He is requesting baseline labs at this time.  He eats a varied diet with minimal fatty foods and red meat.  Never smoked.  Has a couple beers on the weekend and gets 120 minutes of exercise weekly via cycling.  He is married and feels safe in his relationship.  Requesting Cialis refill today.  PMHx includes hypercholesterolemia, vertigo, migraines, ED.  History tabs reviewed and updated.   Review of systems form reviewed and notable for none.   OBJECTIVE:   BP 135/68    Pulse 70    Ht 5' 7.5" (1.715 m)    Wt 188 lb 9.6 oz (85.5 kg)    SpO2 100%    BMI 29.10 kg/m   General: Awake, alert and appropriately responsive  in NAD HEENT: EOMI, PERRL. Neck: Supple Lymph Nodes: No palpable lymphadenopathy Chest: CTAB, normal WOB. Good air movement bilaterally.   Heart: RRR, normal S1, S2. No murmur appreciated Abdomen: Soft, non-tender, non-distended. Normoactive bowel sounds. Extremities: Extremities WWP. Moves all extremities equally. MSK: Normal bulk and tone Neuro: Appropriately responsive to stimuli. No gross deficits appreciated.   ASSESSMENT/PLAN:   HYPERCHOLESTEROLEMIA Previous lipid panel below. Pt opted at that time to make dietary changes. Fasting lipid panel today, discussed that based on these results we may begin medication.  Patient continues to have varied diet with minimal fatty foods and exercises regularly via cycling.   Lipid Panel     Component Value Date/Time   CHOL 248 (H) 04/19/2020 0923   TRIG 99 04/19/2020 0923   HDL 58 04/19/2020 0923   CHOLHDL 4.3 04/19/2020 0923   CHOLHDL 3.8 04/30/2016 0938   VLDL 11 04/30/2016 0938   LDLCALC 173 (H) 04/19/2020 0923   LDLDIRECT 162 (H) 10/31/2011 0920   LABVLDL 17 04/19/2020 0923     Chronic leukopenia CBC today.  Low WBC at last check.  Erectile dysfunction Requesting refill today.   Rx sent to pharmacy.  Annual physical exam Patient is doing well and he has cycling regularly.  Lipid panel, A1c, BMP, CBC at visit today.  Advance directive booklet given.  No smoking or illicit drug use.  Minimal alcohol use.  Patient is very healthy for his age.  Other abnormal glucose Elevated glucose in the 100s in the past.  BMP and A1c today.    Annual Examination  See AVS for age appropriate recommendations.  PHQ score 0, reviewed and discussed.  Blood pressure value is 135/68 goal, discussed.   Considered the following screening exams based upon USPSTF recommendations: Diabetes screening: ordered Screening for elevated cholesterol: ordered HIV testing: discussed Hepatitis C: discussed Hepatitis B: discussed Syphilis if at high risk: discussed Reviewed risk factors for latent tuberculosis and not indicated Colorectal cancer screening: up to date on screening for CRC. Lung cancer screening:  not indicated  See documentation below regarding discussion and indication.   Follow up in 1 year or sooner if indicated.    Shelby Mattocks, DO [facility]

## 2021-05-14 ENCOUNTER — Ambulatory Visit (INDEPENDENT_AMBULATORY_CARE_PROVIDER_SITE_OTHER): Payer: BC Managed Care – PPO | Admitting: Student

## 2021-05-14 ENCOUNTER — Other Ambulatory Visit: Payer: Self-pay

## 2021-05-14 ENCOUNTER — Encounter: Payer: Self-pay | Admitting: Student

## 2021-05-14 VITALS — BP 135/68 | HR 70 | Ht 67.5 in | Wt 188.6 lb

## 2021-05-14 DIAGNOSIS — Z0000 Encounter for general adult medical examination without abnormal findings: Secondary | ICD-10-CM

## 2021-05-14 DIAGNOSIS — E7800 Pure hypercholesterolemia, unspecified: Secondary | ICD-10-CM

## 2021-05-14 DIAGNOSIS — R7309 Other abnormal glucose: Secondary | ICD-10-CM | POA: Insufficient documentation

## 2021-05-14 DIAGNOSIS — N529 Male erectile dysfunction, unspecified: Secondary | ICD-10-CM | POA: Diagnosis not present

## 2021-05-14 DIAGNOSIS — D72819 Decreased white blood cell count, unspecified: Secondary | ICD-10-CM

## 2021-05-14 LAB — POCT GLYCOSYLATED HEMOGLOBIN (HGB A1C): Hemoglobin A1C: 5.9 % — AB (ref 4.0–5.6)

## 2021-05-14 MED ORDER — TADALAFIL 5 MG PO TABS: 2.5000 mg | ORAL_TABLET | Freq: Every day | ORAL | 7 refills | Status: DC | PRN

## 2021-05-14 NOTE — Assessment & Plan Note (Signed)
Requesting refill today.  Rx sent to pharmacy.

## 2021-05-14 NOTE — Assessment & Plan Note (Addendum)
Patient is doing well and he has cycling regularly.  Lipid panel, A1c, BMP, CBC at visit today.  Advance directive booklet given.  No smoking or illicit drug use.  Minimal alcohol use.  Patient is very healthy for his age.

## 2021-05-14 NOTE — Assessment & Plan Note (Signed)
Previous lipid panel below. Pt opted at that time to make dietary changes. Fasting lipid panel today, discussed that based on these results we may begin medication.  Patient continues to have varied diet with minimal fatty foods and exercises regularly via cycling.   Lipid Panel     Component Value Date/Time   CHOL 248 (H) 04/19/2020 0923   TRIG 99 04/19/2020 0923   HDL 58 04/19/2020 0923   CHOLHDL 4.3 04/19/2020 0923   CHOLHDL 3.8 04/30/2016 0938   VLDL 11 04/30/2016 0938   LDLCALC 173 (H) 04/19/2020 0923   LDLDIRECT 162 (H) 10/31/2011 0920   LABVLDL 17 04/19/2020 5409

## 2021-05-14 NOTE — Assessment & Plan Note (Signed)
CBC today.  Low WBC at last check.

## 2021-05-14 NOTE — Assessment & Plan Note (Signed)
Elevated glucose in the 100s in the past.  BMP and A1c today.

## 2021-05-14 NOTE — Patient Instructions (Signed)
It was great to see you today! Thank you for choosing [facility] for your primary care. George Aguilar was seen for annual physical exam.  Our plans for today were:  -Check several labs: Lipid panel, BMP, CBC, hemoglobin A1c. -You are up-to-date on other screening.  I provided you with the advanced care directive booklet.  Please fill this out when you are able after having discussions with your loved ones and return to our office. -Cialis refill  We are checking some labs today. If they are abnormal, I will call you. If they are normal, I will send you a MyChart message or a letter. If you do not hear about your labs in the next 2 weeks, please let us know.  You should return to our clinic in 1 year for annual physical exam.   I recommend that you always bring your medications to each appointment as this makes it easy to ensure you are on the correct medications and helps [place] not miss refills when you need them.  Please arrive 15 minutes before your appointment to ensure smooth check in process.  We appreciate your efforts in making this happen.  Take care and seek immediate care sooner if you develop any concerns.   Thank you for allowing me to participate in your care, Shelby Mattocks, DO 05/14/2021, 9:25 AM PGY-1, [facility]

## 2021-05-14 NOTE — Assessment & Plan Note (Signed)
" >>  ASSESSMENT AND PLAN FOR CHRONIC LEUKOPENIA WRITTEN ON 05/14/2021 10:41 AM BY Jomayra Novitsky, DO  CBC today.  Low WBC at last check. "

## 2021-05-15 LAB — CBC
Hematocrit: 44.5 % (ref 37.5–51.0)
Hemoglobin: 16.3 g/dL (ref 13.0–17.7)
MCH: 33 pg (ref 26.6–33.0)
MCHC: 36.6 g/dL — ABNORMAL HIGH (ref 31.5–35.7)
MCV: 90 fL (ref 79–97)
Platelets: 217 10*3/uL (ref 150–450)
RBC: 4.94 x10E6/uL (ref 4.14–5.80)
RDW: 12.2 % (ref 11.6–15.4)
WBC: 3.5 10*3/uL (ref 3.4–10.8)

## 2021-05-15 LAB — BASIC METABOLIC PANEL
BUN/Creatinine Ratio: 12 (ref 10–24)
BUN: 12 mg/dL (ref 8–27)
CO2: 26 mmol/L (ref 20–29)
Calcium: 9.6 mg/dL (ref 8.6–10.2)
Chloride: 98 mmol/L (ref 96–106)
Creatinine, Ser: 1.01 mg/dL (ref 0.76–1.27)
Glucose: 112 mg/dL — ABNORMAL HIGH (ref 70–99)
Potassium: 4.5 mmol/L (ref 3.5–5.2)
Sodium: 139 mmol/L (ref 134–144)
eGFR: 83 mL/min/{1.73_m2} (ref >59)

## 2021-05-15 LAB — LIPID PANEL
Chol/HDL Ratio: 4.6 ratio (ref 0.0–5.0)
Cholesterol, Total: 255 mg/dL — ABNORMAL HIGH (ref 100–199)
HDL: 55 mg/dL (ref >39)
LDL Chol Calc (NIH): 173 mg/dL — ABNORMAL HIGH (ref 0–99)
Triglycerides: 148 mg/dL (ref 0–149)
VLDL Cholesterol Cal: 27 mg/dL (ref 5–40)

## 2021-05-28 ENCOUNTER — Encounter: Payer: Self-pay | Admitting: Student

## 2021-05-28 ENCOUNTER — Telehealth: Payer: Self-pay | Admitting: Student

## 2021-05-28 MED ORDER — PRAVASTATIN SODIUM 20 MG PO TABS: 20.0000 mg | ORAL_TABLET | Freq: Every day | ORAL | 3 refills | Status: DC

## 2021-05-28 NOTE — Telephone Encounter (Signed)
Discussed lab results. Pt amenable to starting on statin despite not being able to handle well in the past. Will start with Pravastatin 20mg  and see how pt tolerates. Also discussed A1c 5.9% being prediabetes. Counseled on improved diet and exercise.

## 2021-08-24 IMAGING — MR MR ANKLE*L* WO/W CM
9 series · 40 of 40 positions shown · IV contrast (15 ML MULTIHANCE)
Comparison: None.

CLINICAL DATA: Ankle pain since April 06, 2019. Pain and
swelling.

EXAM:
MRI OF THE LEFT ANKLE WITHOUT AND WITH CONTRAST
TECHNIQUE: Multiplanar, multisequence MR imaging of the ankle was performed
before and after the administration of intravenous contrast.
CONTRAST:  15mL MULTIHANCE GADOBENATE DIMEGLUMINE 529 MG/ML IV SOLN

[Series 4: T2 fat-sat · axial · 3.0mm · 0.59mm/px · z∈[-87,+51]mm · 5 of 36 slices shown (1 of 2)]
[im 1/36]
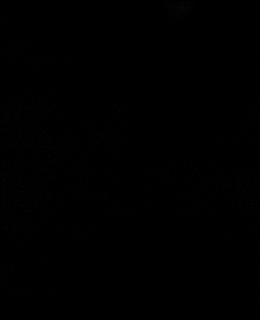
[im 9/36]
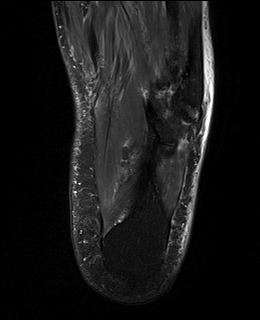
[im 18/36]
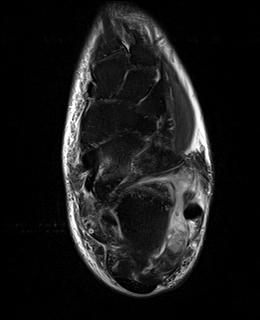
[im 27/36]
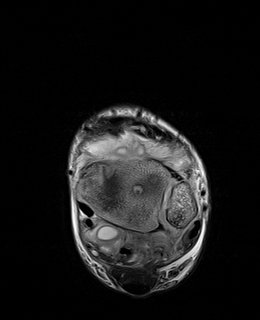
[im 36/36]
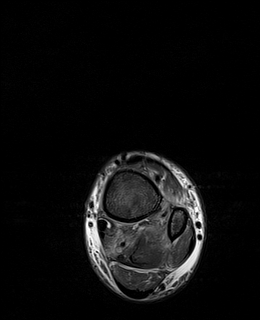

[Series 5: PD fat-sat · axial · 3.0mm · 0.49mm/px · z∈[-83,+47]mm · 5 of 36 slices shown]
[im 1/36]
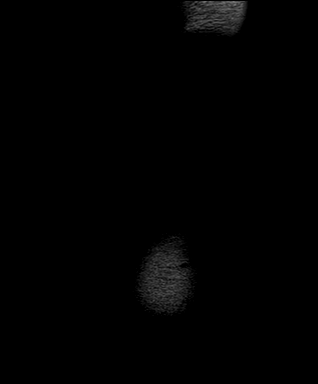
[im 9/36]
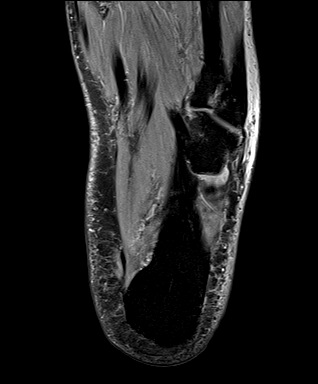
[im 18/36]
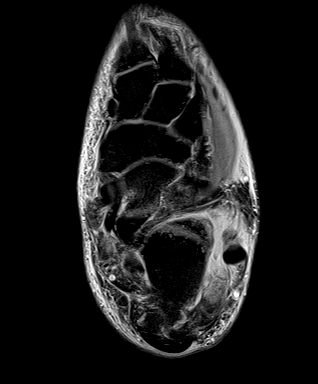
[im 27/36]
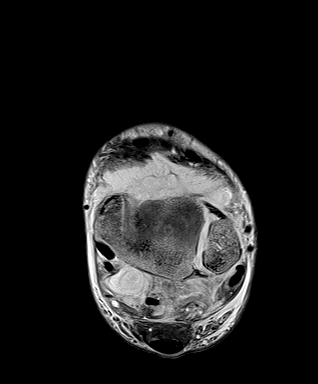
[im 36/36]
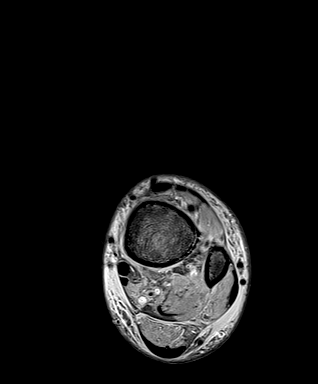

[Series 6: T1 · sagittal · 4.0mm · 0.59mm/px · 3 of 22 slices shown]
[im 1/22]
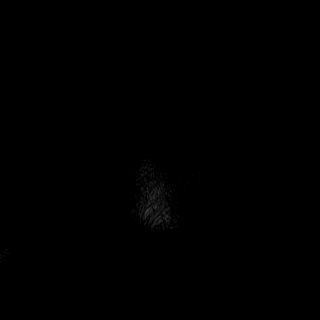
[im 11/22]
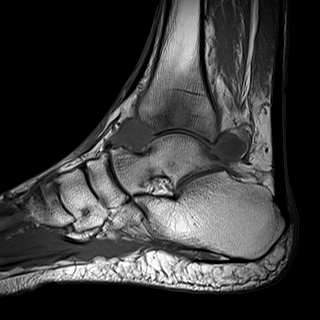
[im 22/22]
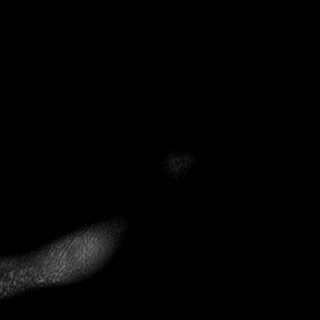

[Series 7: STIR · sagittal · 4.0mm · 0.74mm/px · 3 of 22 slices shown]
[im 1/22]
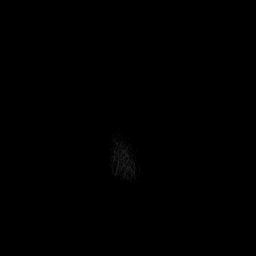
[im 11/22]
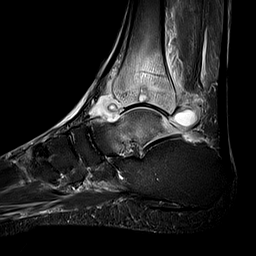
[im 22/22]
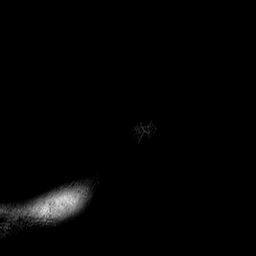

[Series 8: T2 fat-sat · coronal · 3.0mm · 0.70mm/px · 6 of 46 slices shown (2 of 2)]
[im 1/46]
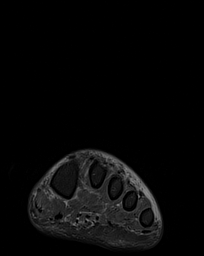
[im 10/46]
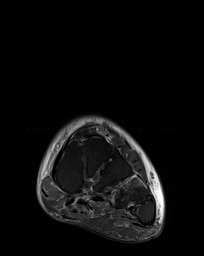
[im 19/46]
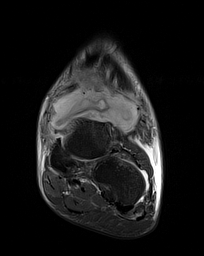
[im 28/46]
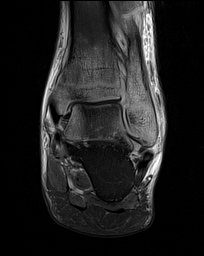
[im 37/46]
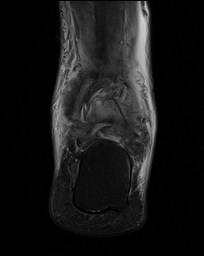
[im 46/46]
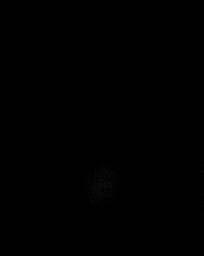

[Series 9: T1 fat-sat · axial · 3.0mm · 0.70mm/px · z∈[-82,+48]mm · 5 of 36 slices shown (1 of 4)]
[im 1/36]
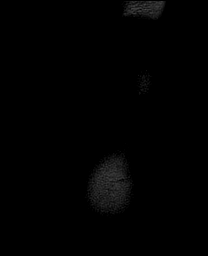
[im 9/36]
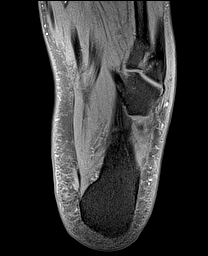
[im 18/36]
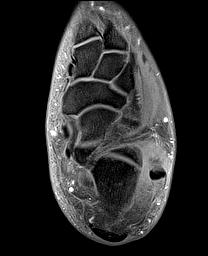
[im 27/36]
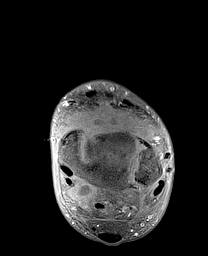
[im 36/36]
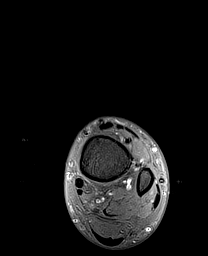

[Series 10: T1 fat-sat · axial · 3.0mm · 0.70mm/px · z∈[-87,+52]mm · 5 of 36 slices shown (2 of 4)]
[im 1/36]
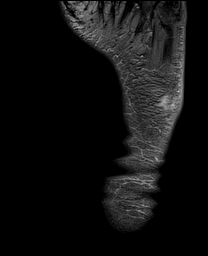
[im 9/36]
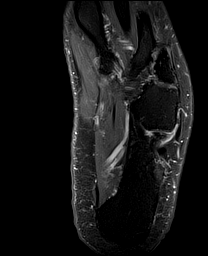
[im 18/36]
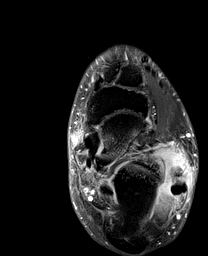
[im 27/36]
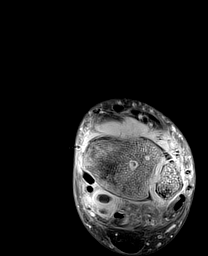
[im 36/36]
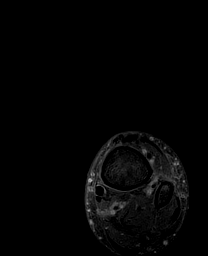

[Series 11: T1 fat-sat · coronal · 4.0mm · 0.62mm/px · 5 of 34 slices shown (3 of 4)]
[im 1/34]
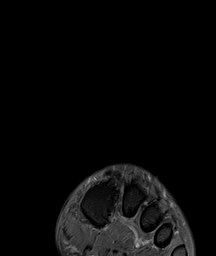
[im 9/34]
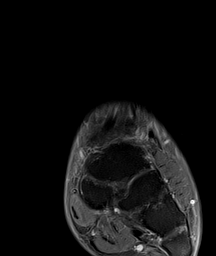
[im 17/34]
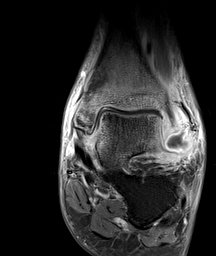
[im 25/34]
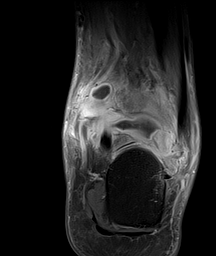
[im 34/34]
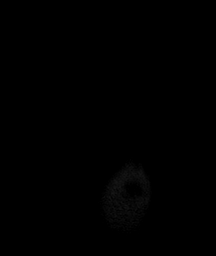

[Series 12: T1 fat-sat · sagittal · 4.0mm · 0.56mm/px · 3 of 22 slices shown (4 of 4)]
[im 1/22]
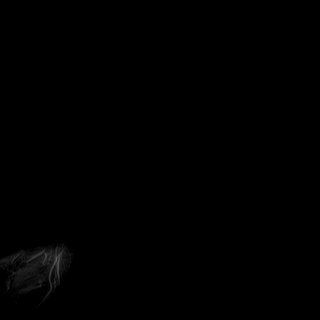
[im 11/22]
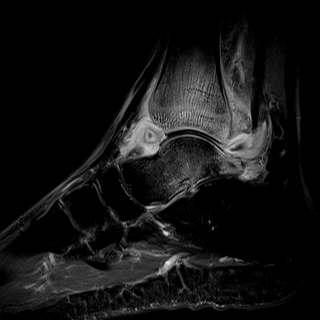
[im 22/22]
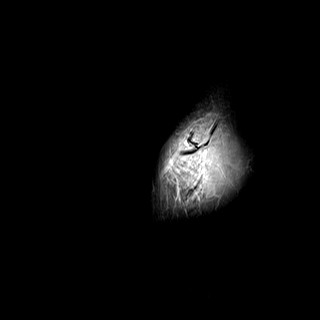

[40 of 40 positions shown; findings below may reference images not displayed]

FINDINGS: TENDONS

Peroneal: Peroneal longus tendon intact. Peroneal brevis intact.

Posteromedial: Posterior tibial tendon intact. Flexor hallucis
longus tendon intact. Flexor digitorum longus tendon intact.

Anterior: Tibialis anterior tendon intact. Extensor hallucis longus
tendon intact Extensor digitorum longus tendon intact.

Achilles:  Intact.

Plantar Fascia: Intact.

LIGAMENTS

Lateral: Anterior talofibular ligament intact. Calcaneofibular
ligament intact. Posterior talofibular ligament intact. Anterior and
posterior tibiofibular ligaments intact.

Medial: Deltoid ligament intact. Spring ligament intact.

CARTILAGE

Ankle Joint: Large ankle joint effusion with severe synovial
enhancement. Severe periarticular marrow edema in the distal fibula,
distal tibia, and talus with enhancement on postcontrast imaging.
Possible subtle erosion along the anterior margin of the distal
tibia. Mild surrounding soft tissue edema.

Subtalar Joints/Sinus Tarsi: Normal subtalar joints. No subtalar
joint effusion. Normal sinus tarsi.

Bones: No other marrow signal abnormality. No acute fracture or
dislocation. Minimal osteoarthritis of the talonavicular joint.

Soft Tissue: No fluid collection or hematoma. No aggressive osseous
lesion.
IMPRESSION: IMPRESSION
1. Large ankle joint effusion with severe synovitis, extensive
periarticular marrow edema centered around the ankle joint and mild
edema in the surrounding soft tissues. Overall findings are most
concerning for septic arthritis until proven otherwise. An
inflammatory arthropathy can have a similar appearance. Recommend
further evaluation with an arthrocentesis.

## 2021-08-27 DIAGNOSIS — R519 Headache, unspecified: Secondary | ICD-10-CM | POA: Diagnosis not present

## 2021-08-27 DIAGNOSIS — Z20822 Contact with and (suspected) exposure to covid-19: Secondary | ICD-10-CM | POA: Diagnosis not present

## 2021-08-27 DIAGNOSIS — R509 Fever, unspecified: Secondary | ICD-10-CM | POA: Diagnosis not present

## 2021-08-27 DIAGNOSIS — R051 Acute cough: Secondary | ICD-10-CM | POA: Diagnosis not present

## 2022-03-13 ENCOUNTER — Telehealth: Payer: Self-pay

## 2022-03-13 NOTE — Patient Outreach (Signed)
  Care Coordination   03/13/2022 Name: SHAQUELLE HERNON MRN: [id number] DOB: [birth year]   Care Coordination Outreach Attempts:  An unsuccessful telephone outreach was attempted today to offer the patient information about available care coordination services as a benefit of their health plan.   Follow Up Plan:  Additional outreach attempts will be made to offer the patient care coordination information and services.   Encounter Outcome:  No Answer  Care Coordination Interventions Activated:  No   Care Coordination Interventions:  No, not indicated    Johnney Killian, RN, BSN, CCM Care Management Coordinator [facility] Phone: [phone]: [phone]

## 2022-03-28 ENCOUNTER — Telehealth: Payer: Self-pay

## 2022-03-28 NOTE — Patient Outreach (Signed)
  Care Coordination   03/28/2022 Name: NAYIB REMER MRN: [id number] DOB: [birth year]   Care Coordination Outreach Attempts:  A second unsuccessful outreach was attempted today to offer the patient with information about available care coordination services as a benefit of their health plan.     Follow Up Plan:  Additional outreach attempts will be made to offer the patient care coordination information and services.   Encounter Outcome:  No Answer  Care Coordination Interventions Activated:  No   Care Coordination Interventions:  No, not indicated    Jodelle Gross, RN, BSN, CCM Care Management Coordinator [facility] Phone: [phone]/Fax: [phone]

## 2022-05-16 ENCOUNTER — Ambulatory Visit (INDEPENDENT_AMBULATORY_CARE_PROVIDER_SITE_OTHER): Payer: BC Managed Care – PPO | Admitting: Student

## 2022-05-16 ENCOUNTER — Encounter: Payer: Self-pay | Admitting: Student

## 2022-05-16 ENCOUNTER — Other Ambulatory Visit (HOSPITAL_COMMUNITY)
Admission: RE | Admit: 2022-05-16 | Discharge: 2022-05-16 | Disposition: A | Discharge status: Home / Self Care | Payer: BC Managed Care – PPO | Source: Ambulatory Visit | Attending: Family Medicine | Admitting: Family Medicine

## 2022-05-16 VITALS — BP 128/78 | HR 74 | Ht 67.5 in | Wt 182.0 lb

## 2022-05-16 DIAGNOSIS — R7303 Prediabetes: Secondary | ICD-10-CM | POA: Diagnosis not present

## 2022-05-16 DIAGNOSIS — Z113 Encounter for screening for infections with a predominantly sexual mode of transmission: Secondary | ICD-10-CM | POA: Insufficient documentation

## 2022-05-16 DIAGNOSIS — Z125 Encounter for screening for malignant neoplasm of prostate: Secondary | ICD-10-CM | POA: Diagnosis not present

## 2022-05-16 DIAGNOSIS — Z0000 Encounter for general adult medical examination without abnormal findings: Secondary | ICD-10-CM

## 2022-05-16 DIAGNOSIS — E7800 Pure hypercholesterolemia, unspecified: Secondary | ICD-10-CM

## 2022-05-16 DIAGNOSIS — Z23 Encounter for immunization: Secondary | ICD-10-CM | POA: Diagnosis not present

## 2022-05-16 DIAGNOSIS — N529 Male erectile dysfunction, unspecified: Secondary | ICD-10-CM | POA: Diagnosis not present

## 2022-05-16 HISTORY — DX: Encounter for screening for malignant neoplasm of prostate: Z12.5

## 2022-05-16 HISTORY — DX: Encounter for screening for infections with a predominantly sexual mode of transmission: Z11.3

## 2022-05-16 LAB — POCT GLYCOSYLATED HEMOGLOBIN (HGB A1C): Hemoglobin A1C: 5.5 % (ref 4.0–5.6)

## 2022-05-16 MED ORDER — TADALAFIL 5 MG PO TABS: 2.5000 mg | ORAL_TABLET | Freq: Every day | ORAL | 2 refills | Status: DC | PRN

## 2022-05-16 NOTE — Assessment & Plan Note (Addendum)
Doing well with improved diet and exercise regimen.  No longer prediabetic.  Advanced directive booklet given.  He is very healthy for his age, continue annual physical.  May see sooner depending on lipid panel.

## 2022-05-16 NOTE — Progress Notes (Signed)
    SUBJECTIVE:   Chief compliant/HPI: annual examination  George Aguilar is a 67 y.o. who presents today for an annual exam.   History tabs reviewed and updated. States he took pravastatin for few months but then stopped.  He has made significant changes regarding his diet and increased cycling.  He is amenable to restarting statin if his cholesterol did not improve to a more acceptable level.  He is amenable to STD testing today clean bill of health today.  OBJECTIVE:  BP 128/78   Pulse 74   Ht 5' 7.5" (1.715 m)   Wt 182 lb (82.6 kg)   SpO2 98%   BMI 28.08 kg/m   General: Awake, alert, NAD CV: RRR, no murmurs auscultated Pulm: CTAB, normal WOB Abdomen: Soft, nontender, normoactive bowel sounds  ASSESSMENT/PLAN:  Annual physical exam Assessment & Plan: Doing well with improved diet and exercise regimen.  No longer prediabetic.  Advanced directive booklet given.  He is very healthy for his age, continue annual physical.  May see sooner depending on lipid panel.   HYPERCHOLESTEROLEMIA Assessment & Plan: Previous LDL in the 170s, took pravastatin for several days but stopped.  He is amenable to restarting depending on today's lipid panel result.  If restarting, would recommend recheck in 3 months of LDL.  Orders: -     Comprehensive metabolic panel -     Lipid panel  Prediabetes -     POCT glycosylated hemoglobin (Hb A1C)  Erectile dysfunction, unspecified erectile dysfunction type -     Tadalafil; Take 0.5 tablets (2.5 mg total) by mouth daily as needed for erectile dysfunction.  Dispense: 30 tablet; Refill: 2  Screening examination for STD (sexually transmitted disease) -     Hepatitis Panel (REFL) -     Urine cytology ancillary only -     HIV Antibody (routine testing w rflx) -     RPR -     Acute Viral Hepatitis (HAV, HBV, HCV)  Prostate cancer screening -     PSA  Need for pneumococcal 20-valent conjugate vaccination -     Pneumococcal conjugate vaccine  20-valent  Annual Examination  See AVS for age appropriate recommendations.  PHQ score 0, reviewed and discussed.  Blood pressure value is at goal, discussed.   Considered the following screening exams based upon USPSTF recommendations: Diabetes screening: ordered Screening for elevated cholesterol: ordered HIV testing: ordered Hepatitis C: ordered Hepatitis B: ordered Syphilis if at high risk: ordered Reviewed risk factors for latent tuberculosis and not indicated Colorectal cancer screening: up to date on screening for CRC. Lung cancer screening:  not indicated Prostate cancer screening: Requested by patient, ordered.  Return in about 1 year (around 05/17/2023) for Annual physical.   Wells Guiles, [place]

## 2022-05-16 NOTE — Patient Instructions (Addendum)
It was great to see you today! Thank you for choosing [facility] for your primary care. George Aguilar was seen for annual physical exam.  Today we addressed: A1c: This is improved and you are no longer prediabetic. I have refilled your Cialis We are rechecking your cholesterol.  If this is still elevated, I am going to recommend restarting a cholesterol medication and rechecking several months after restarting. Keep up the great work with your healthy diet and exercise.  Another 1 to time to be difficult for this you are doing an excellent job. You can stop the extra vitamin supplements you are taking.  A once a day multivitamin is more than enough and you can save your money otherwise. I provided you with an advanced directive booklet to discuss with your family as needed.  If you haven't already, sign up for My Chart to have easy access to your labs results, and communication with your primary care physician.  We are checking some labs today. If they are abnormal, I will call you. If they are normal, I will send you a MyChart message (if it is active) or a letter in the mail. If you do not hear about your labs in the next 2 weeks, please call the office. I recommend that you always bring your medications to each appointment as this makes it easy to ensure you are on the correct medications and helps [place] not miss refills when you need them. Call the clinic at [phone] if your symptoms worsen or you have any concerns.  You should return to our clinic Return in about 1 year (around 05/17/2023) for Annual physical. Please arrive 15 minutes before your appointment to ensure smooth check in process.  We appreciate your efforts in making this happen.  Thank you for allowing me to participate in your care, Wells Guiles, DO 05/16/2022, 9:09 AM PGY-2, [place]

## 2022-05-16 NOTE — Assessment & Plan Note (Signed)
Previous LDL in the 170s, took pravastatin for several days but stopped.  He is amenable to restarting depending on today's lipid panel result.  If restarting, would recommend recheck in 3 months of LDL.

## 2022-05-17 LAB — LIPID PANEL
Chol/HDL Ratio: 4.1 ratio (ref 0.0–5.0)
Cholesterol, Total: 235 mg/dL — ABNORMAL HIGH (ref 100–199)
HDL: 57 mg/dL (ref >39)
LDL Chol Calc (NIH): 168 mg/dL — ABNORMAL HIGH (ref 0–99)
Triglycerides: 62 mg/dL (ref 0–149)
VLDL Cholesterol Cal: 10 mg/dL (ref 5–40)

## 2022-05-17 LAB — RPR: RPR Ser Ql: NONREACTIVE

## 2022-05-17 LAB — PSA: Prostate Specific Ag, Serum: 0.7 ng/mL (ref 0.0–4.0)

## 2022-05-17 LAB — COMPREHENSIVE METABOLIC PANEL
ALT: 29 IU/L (ref 0–44)
AST: 24 IU/L (ref 0–40)
Albumin/Globulin Ratio: 2 (ref 1.2–2.2)
Albumin: 4.7 g/dL (ref 3.9–4.9)
Alkaline Phosphatase: 40 IU/L — ABNORMAL LOW (ref 44–121)
BUN/Creatinine Ratio: 11 (ref 10–24)
BUN: 11 mg/dL (ref 8–27)
Bilirubin Total: 1 mg/dL (ref 0.0–1.2)
CO2: 25 mmol/L (ref 20–29)
Calcium: 9.5 mg/dL (ref 8.6–10.2)
Chloride: 100 mmol/L (ref 96–106)
Creatinine, Ser: 1.01 mg/dL (ref 0.76–1.27)
Globulin, Total: 2.3 g/dL (ref 1.5–4.5)
Glucose: 111 mg/dL — ABNORMAL HIGH (ref 70–99)
Potassium: 4.4 mmol/L (ref 3.5–5.2)
Sodium: 139 mmol/L (ref 134–144)
Total Protein: 7 g/dL (ref 6.0–8.5)
eGFR: 83 mL/min/{1.73_m2} (ref >59)

## 2022-05-17 LAB — ACUTE VIRAL HEPATITIS (HAV, HBV, HCV)
HCV Ab: NONREACTIVE
Hep A IgM: NEGATIVE
Hep B C IgM: NEGATIVE
Hepatitis B Surface Ag: NEGATIVE

## 2022-05-17 LAB — HCV INTERPRETATION

## 2022-05-17 LAB — HIV ANTIBODY (ROUTINE TESTING W REFLEX): HIV Screen 4th Generation wRfx: NONREACTIVE

## 2022-05-19 LAB — URINE CYTOLOGY ANCILLARY ONLY
Chlamydia: NEGATIVE
Comment: NEGATIVE
Comment: NEGATIVE
Comment: NORMAL
Neisseria Gonorrhea: NEGATIVE
Trichomonas: NEGATIVE

## 2022-05-20 ENCOUNTER — Other Ambulatory Visit: Payer: Self-pay | Admitting: Student

## 2022-05-20 ENCOUNTER — Encounter: Payer: Self-pay | Admitting: Student

## 2022-05-20 DIAGNOSIS — E7800 Pure hypercholesterolemia, unspecified: Secondary | ICD-10-CM

## 2022-05-20 MED ORDER — PRAVASTATIN SODIUM 20 MG PO TABS: 20.0000 mg | ORAL_TABLET | Freq: Every day | ORAL | 0 refills | Status: DC

## 2023-05-18 ENCOUNTER — Encounter: Payer: BC Managed Care – PPO | Admitting: Family Medicine

## 2023-06-09 ENCOUNTER — Encounter: Payer: BC Managed Care – PPO | Admitting: Student

## 2023-06-10 ENCOUNTER — Ambulatory Visit: Payer: BC Managed Care – PPO | Admitting: Family Medicine

## 2023-06-10 ENCOUNTER — Encounter: Payer: Self-pay | Admitting: Family Medicine

## 2023-06-10 VITALS — BP 128/70 | HR 68 | Ht 67.5 in | Wt 185.0 lb

## 2023-06-10 DIAGNOSIS — Z125 Encounter for screening for malignant neoplasm of prostate: Secondary | ICD-10-CM

## 2023-06-10 DIAGNOSIS — Z0000 Encounter for general adult medical examination without abnormal findings: Secondary | ICD-10-CM | POA: Diagnosis not present

## 2023-06-10 DIAGNOSIS — E7800 Pure hypercholesterolemia, unspecified: Secondary | ICD-10-CM

## 2023-06-10 DIAGNOSIS — N529 Male erectile dysfunction, unspecified: Secondary | ICD-10-CM

## 2023-06-10 DIAGNOSIS — R7303 Prediabetes: Secondary | ICD-10-CM

## 2023-06-10 LAB — POCT GLYCOSYLATED HEMOGLOBIN (HGB A1C): Hemoglobin A1C: 5.6 % (ref 4.0–5.6)

## 2023-06-10 MED ORDER — TADALAFIL 5 MG PO TABS: 2.5000 mg | ORAL_TABLET | Freq: Every day | ORAL | 2 refills | Status: AC | PRN

## 2023-06-10 NOTE — Assessment & Plan Note (Signed)
Stable on Cialis, refilled

## 2023-06-10 NOTE — Progress Notes (Addendum)
    SUBJECTIVE:   CHIEF COMPLAINT / HPI:   Here for checkup  He would like to have PSA checked as he reports that some nights he has to urinate frequently at night.  However this is not exceptionally common and he is able to empty his bladder completely without issue.  Denies family history of prostate cancer  Reports improving his diet recently, eating more veggies  Has a history of hypercholesterolemia, was previously on a statin but he self-discontinued this  History of ED for which he uses Cialis  Does not smoke.  Occasionally drinks alcohol socially No concerns about his weight Exercises 60 minutes/week Does not want STD testing today No recent falls    PERTINENT  PMH / PSH: History of prediabetes, hypercholesterolemia  OBJECTIVE:   BP 128/70   Pulse 68   Ht 5' 7.5" (1.715 m)   Wt 185 lb (83.9 kg)   SpO2 98%   BMI 28.55 kg/m   General: NAD, pleasant, able to participate in exam Cardiac: RRR, no murmurs auscultated Respiratory: CTAB, normal WOB Abdomen: soft, non-tender, non-distended, normoactive bowel sounds Extremities: warm and well perfused, no edema or cyanosis Skin: warm and dry, no rashes noted Neuro: alert, no obvious focal deficits, speech normal Psych: Normal affect and mood  ASSESSMENT/PLAN:   Assessment & Plan Annual physical exam Check CMP, lipid panel, PSA.  Otherwise appears healthy, not taking any medications regularly.  Patient amenable to restarting statin if he has persistent hypercholesterolemia Prediabetes A1c is 5.6 today, not having medications.  Continue diet control Erectile dysfunction, unspecified erectile dysfunction type Stable on Cialis, refilled   Vonna Drafts, MD [facility]

## 2023-06-10 NOTE — Assessment & Plan Note (Signed)
A1c is 5.6 today, not having medications.  Continue diet control

## 2023-06-10 NOTE — Patient Instructions (Signed)
I will let you know if any results from today are abnormal  I have refilled cialis

## 2023-06-10 NOTE — Assessment & Plan Note (Signed)
Check CMP, lipid panel, PSA.  Otherwise appears healthy, not taking any medications regularly.  Patient amenable to restarting statin if he has persistent hypercholesterolemia

## 2023-06-11 LAB — COMPREHENSIVE METABOLIC PANEL
ALT: 29 [IU]/L (ref 0–44)
AST: 26 [IU]/L (ref 0–40)
Albumin: 4.4 g/dL (ref 3.9–4.9)
Alkaline Phosphatase: 42 [IU]/L — ABNORMAL LOW (ref 44–121)
BUN/Creatinine Ratio: 13 (ref 10–24)
BUN: 12 mg/dL (ref 8–27)
Bilirubin Total: 0.8 mg/dL (ref 0.0–1.2)
CO2: 24 mmol/L (ref 20–29)
Calcium: 9.6 mg/dL (ref 8.6–10.2)
Chloride: 100 mmol/L (ref 96–106)
Creatinine, Ser: 0.96 mg/dL (ref 0.76–1.27)
Globulin, Total: 2.9 g/dL (ref 1.5–4.5)
Glucose: 110 mg/dL — ABNORMAL HIGH (ref 70–99)
Potassium: 4.7 mmol/L (ref 3.5–5.2)
Sodium: 138 mmol/L (ref 134–144)
Total Protein: 7.3 g/dL (ref 6.0–8.5)
eGFR: 87 mL/min/{1.73_m2} (ref >59)

## 2023-06-11 LAB — LIPID PANEL
Chol/HDL Ratio: 4 {ratio} (ref 0.0–5.0)
Cholesterol, Total: 225 mg/dL — ABNORMAL HIGH (ref 100–199)
HDL: 56 mg/dL (ref >39)
LDL Chol Calc (NIH): 155 mg/dL — ABNORMAL HIGH (ref 0–99)
Triglycerides: 80 mg/dL (ref 0–149)
VLDL Cholesterol Cal: 14 mg/dL (ref 5–40)

## 2023-06-11 LAB — PSA: Prostate Specific Ag, Serum: 1 ng/mL (ref 0.0–4.0)

## 2023-06-15 ENCOUNTER — Other Ambulatory Visit: Payer: Self-pay | Admitting: Family Medicine

## 2023-06-15 DIAGNOSIS — E7800 Pure hypercholesterolemia, unspecified: Secondary | ICD-10-CM

## 2023-06-15 MED ORDER — PRAVASTATIN SODIUM 20 MG PO TABS: 20.0000 mg | ORAL_TABLET | Freq: Every day | ORAL | 3 refills | Status: DC

## 2023-10-20 ENCOUNTER — Encounter: Payer: Self-pay | Admitting: *Deleted

## 2024-06-10 ENCOUNTER — Ambulatory Visit: Payer: Self-pay | Admitting: Family Medicine

## 2024-06-10 ENCOUNTER — Encounter: Payer: Self-pay | Admitting: Family Medicine

## 2024-06-10 ENCOUNTER — Ambulatory Visit: Admitting: Family Medicine

## 2024-06-10 VITALS — BP 136/79 | HR 70 | Ht 67.0 in | Wt 188.8 lb

## 2024-06-10 DIAGNOSIS — D72819 Decreased white blood cell count, unspecified: Secondary | ICD-10-CM

## 2024-06-10 DIAGNOSIS — R7303 Prediabetes: Secondary | ICD-10-CM | POA: Diagnosis not present

## 2024-06-10 DIAGNOSIS — Z125 Encounter for screening for malignant neoplasm of prostate: Secondary | ICD-10-CM | POA: Diagnosis not present

## 2024-06-10 DIAGNOSIS — E7800 Pure hypercholesterolemia, unspecified: Secondary | ICD-10-CM

## 2024-06-10 DIAGNOSIS — Z0000 Encounter for general adult medical examination without abnormal findings: Secondary | ICD-10-CM | POA: Diagnosis not present

## 2024-06-10 LAB — POCT GLYCOSYLATED HEMOGLOBIN (HGB A1C): Hemoglobin A1C: 5.5 % (ref 4.0–5.6)

## 2024-06-10 NOTE — Assessment & Plan Note (Signed)
" >>  ASSESSMENT AND PLAN FOR CHRONIC LEUKOPENIA WRITTEN ON 06/10/2024  9:44 AM BY Paije Goodhart, MD  Repeat CBC today. ?BEN. "

## 2024-06-10 NOTE — Assessment & Plan Note (Signed)
 Repeat CBC today. ?BEN.

## 2024-06-10 NOTE — Patient Instructions (Signed)
 It was a pleasure meeting you today. Keep up the great work. I will send you a MyChart message with your test results. Come back in 1 year or sooner if needed.

## 2024-06-10 NOTE — Progress Notes (Signed)
" ° ° °  SUBJECTIVE:   Chief compliant/HPI: annual examination  George Aguilar is a 69 y.o. who presents today for an annual exam.   Stopped pravastatin , wants to try natural remedies. Continues to take Cialis  as needed.  History tabs reviewed and updated.   OBJECTIVE:   BP 136/79   Pulse 70   Ht 5' 7 (1.702 m)   Wt 188 lb 12.8 oz (85.6 kg)   SpO2 100%   BMI 29.57 kg/m   General: Alert and oriented, in NAD Skin: Warm, dry, and intact without lesions HEENT: NCAT, EOM grossly normal, midline nasal septum Cardiac: RRR, no m/r/g appreciated Respiratory: CTAB, breathing and speaking comfortably on RA Abdominal: Soft, nontender, nondistended, normoactive bowel sounds Extremities: Moves all extremities grossly equally Neurological: No gross focal deficit Psychiatric: Appropriate mood and affect   ASSESSMENT/PLAN:   Assessment & Plan Chronic leukopenia Repeat CBC today. ?BEN. Annual physical exam   Annual Examination  See AVS for age appropriate recommendations.  PHQ score 0, reviewed. Blood pressure value is at goal, discussed.   Considered the following screening exams based upon USPSTF recommendations: Diabetes screening: ordered HIV testing:discussed and declined Hepatitis C: discussed and declined Hepatitis B:discussed and declined Syphilis if at high risk: discussed and declined GC/CT not at high risk and not ordered. Lipid panel (nonfasting or fasting) discussed based upon AHA recommendations and ordered.   Cancer Screening Discussion  Lung cancer screening:not indicated as does not meet criteria.  See documentation below regarding indications/risks/benefits.  Colorectal cancer screening: up to date on screening for CRC. Messaged GI Dr. Kristie to see if she has pathology results -- likely needs next CRC in 2032. PSA discussed and after engaging in discussion of possible risks, benefits and complications of screening. PSA: requested and ordered. Vaccinations  declined.   Follow up in 1 year or sooner if indicated.  MyChart Activation: Already signed up  George Redo, MD [facility]   "

## 2024-06-11 ENCOUNTER — Encounter: Payer: Self-pay | Admitting: Family Medicine

## 2024-06-11 LAB — CBC WITH DIFFERENTIAL/PLATELET
Basophils Absolute: 0 10*3/uL (ref 0.0–0.2)
Basos: 1 %
EOS (ABSOLUTE): 0.1 10*3/uL (ref 0.0–0.4)
Eos: 3 %
Hematocrit: 45.5 % (ref 37.5–51.0)
Hemoglobin: 16.1 g/dL (ref 13.0–17.7)
Immature Grans (Abs): 0 10*3/uL (ref 0.0–0.1)
Immature Granulocytes: 0 %
Lymphocytes Absolute: 1.5 10*3/uL (ref 0.7–3.1)
Lymphs: 46 %
MCH: 32.5 pg (ref 26.6–33.0)
MCHC: 35.4 g/dL (ref 31.5–35.7)
MCV: 92 fL (ref 79–97)
Monocytes Absolute: 0.3 10*3/uL (ref 0.1–0.9)
Monocytes: 10 %
Neutrophils Absolute: 1.3 10*3/uL — ABNORMAL LOW (ref 1.4–7.0)
Neutrophils: 40 %
Platelets: 218 10*3/uL (ref 150–450)
RBC: 4.96 x10E6/uL (ref 4.14–5.80)
RDW: 12 % (ref 11.6–15.4)
WBC: 3.2 10*3/uL — ABNORMAL LOW (ref 3.4–10.8)

## 2024-06-11 LAB — COMPREHENSIVE METABOLIC PANEL WITH GFR
ALT: 32 [IU]/L (ref 0–44)
AST: 24 [IU]/L (ref 0–40)
Albumin: 4.6 g/dL (ref 3.9–4.9)
Alkaline Phosphatase: 41 [IU]/L — ABNORMAL LOW (ref 47–123)
BUN/Creatinine Ratio: 13 (ref 10–24)
BUN: 13 mg/dL (ref 8–27)
Bilirubin Total: 1.1 mg/dL (ref 0.0–1.2)
CO2: 23 mmol/L (ref 20–29)
Calcium: 9.5 mg/dL (ref 8.6–10.2)
Chloride: 100 mmol/L (ref 96–106)
Creatinine, Ser: 1.02 mg/dL (ref 0.76–1.27)
Globulin, Total: 2.8 g/dL (ref 1.5–4.5)
Glucose: 106 mg/dL — ABNORMAL HIGH (ref 70–99)
Potassium: 4.2 mmol/L (ref 3.5–5.2)
Sodium: 138 mmol/L (ref 134–144)
Total Protein: 7.4 g/dL (ref 6.0–8.5)
eGFR: 81 mL/min/{1.73_m2}

## 2024-06-11 LAB — LIPID PANEL
Chol/HDL Ratio: 4.4 ratio (ref 0.0–5.0)
Cholesterol, Total: 238 mg/dL — ABNORMAL HIGH (ref 100–199)
HDL: 54 mg/dL
LDL Chol Calc (NIH): 172 mg/dL — ABNORMAL HIGH (ref 0–99)
Triglycerides: 71 mg/dL (ref 0–149)
VLDL Cholesterol Cal: 12 mg/dL (ref 5–40)

## 2024-06-11 LAB — PSA: Prostate Specific Ag, Serum: 0.9 ng/mL (ref 0.0–4.0)

## 2024-06-11 MED ORDER — PRAVASTATIN SODIUM 40 MG PO TABS: 40.0000 mg | ORAL_TABLET | Freq: Every day | ORAL | 3 refills | Status: AC

## 2024-06-16 ENCOUNTER — Encounter: Payer: Self-pay | Admitting: Family Medicine
